# Patient Record
Sex: Female | Born: 1985 | Race: Black or African American | Hispanic: No | Marital: Single | Smoking: Never smoker
Health system: Southern US, Community
[De-identification: ages and names within clinical notes are randomized; demographics above are authoritative.]

## PROBLEM LIST (undated history)

## (undated) ENCOUNTER — Inpatient Hospital Stay (HOSPITAL_COMMUNITY): Payer: Self-pay

## (undated) DIAGNOSIS — L309 Dermatitis, unspecified: Secondary | ICD-10-CM

## (undated) HISTORY — DX: Dermatitis, unspecified: L30.9

## (undated) HISTORY — PX: NO PAST SURGERIES: SHX2092

---
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
## 2006-05-14 ENCOUNTER — Emergency Department (HOSPITAL_COMMUNITY): Admission: EM | Admit: 2006-05-14 | Discharge: 2006-05-14 | Payer: Self-pay | Admitting: Emergency Medicine

## 2008-09-11 ENCOUNTER — Emergency Department (HOSPITAL_COMMUNITY): Admission: EM | Admit: 2008-09-11 | Discharge: 2008-09-11 | Payer: Self-pay | Admitting: Emergency Medicine

## 2009-03-06 ENCOUNTER — Emergency Department (HOSPITAL_COMMUNITY): Admission: EM | Admit: 2009-03-06 | Discharge: 2009-03-06 | Payer: Self-pay | Admitting: Family Medicine

## 2010-03-29 LAB — GC/CHLAMYDIA PROBE AMP, GENITAL
Chlamydia, DNA Probe: NEGATIVE
GC Probe Amp, Genital: NEGATIVE

## 2010-03-29 LAB — POCT URINALYSIS DIP (DEVICE)
Bilirubin Urine: NEGATIVE
Glucose, UA: NEGATIVE mg/dL
Hgb urine dipstick: NEGATIVE
Ketones, ur: NEGATIVE mg/dL
Nitrite: NEGATIVE
Protein, ur: NEGATIVE mg/dL
Specific Gravity, Urine: 1.02 (ref 1.005–1.030)
Urobilinogen, UA: 1 mg/dL (ref 0.0–1.0)
pH: 7 (ref 5.0–8.0)

## 2010-03-29 LAB — WET PREP, GENITAL
Trich, Wet Prep: NONE SEEN
WBC, Wet Prep HPF POC: NONE SEEN
Yeast Wet Prep HPF POC: NONE SEEN

## 2010-03-29 LAB — POCT PREGNANCY, URINE: Preg Test, Ur: NEGATIVE

## 2010-04-10 LAB — POCT URINALYSIS DIP (DEVICE)
Bilirubin Urine: NEGATIVE
Glucose, UA: NEGATIVE mg/dL
Ketones, ur: NEGATIVE mg/dL
Nitrite: NEGATIVE
Protein, ur: 30 mg/dL — AB
Specific Gravity, Urine: >=1.03 (ref 1.005–1.030)
Urobilinogen, UA: 1 mg/dL (ref 0.0–1.0)
pH: 6 (ref 5.0–8.0)

## 2010-04-10 LAB — WET PREP, GENITAL: Trich, Wet Prep: NONE SEEN

## 2010-04-10 LAB — GC/CHLAMYDIA PROBE AMP, GENITAL
Chlamydia, DNA Probe: NEGATIVE
GC Probe Amp, Genital: NEGATIVE

## 2010-12-16 ENCOUNTER — Emergency Department (HOSPITAL_COMMUNITY)
Admission: EM | Admit: 2010-12-16 | Discharge: 2010-12-16 | Disposition: A | Discharge status: Home / Self Care | Payer: Self-pay | Attending: Emergency Medicine | Admitting: Emergency Medicine

## 2010-12-16 ENCOUNTER — Encounter: Payer: Self-pay | Admitting: *Deleted

## 2010-12-16 DIAGNOSIS — R059 Cough, unspecified: Secondary | ICD-10-CM | POA: Insufficient documentation

## 2010-12-16 DIAGNOSIS — R509 Fever, unspecified: Secondary | ICD-10-CM | POA: Insufficient documentation

## 2010-12-16 DIAGNOSIS — R079 Chest pain, unspecified: Secondary | ICD-10-CM | POA: Insufficient documentation

## 2010-12-16 DIAGNOSIS — R51 Headache: Secondary | ICD-10-CM | POA: Insufficient documentation

## 2010-12-16 DIAGNOSIS — R05 Cough: Secondary | ICD-10-CM | POA: Insufficient documentation

## 2010-12-16 DIAGNOSIS — J45909 Unspecified asthma, uncomplicated: Secondary | ICD-10-CM | POA: Insufficient documentation

## 2010-12-16 MED ORDER — KETOROLAC TROMETHAMINE 60 MG/2ML IM SOLN
60.0000 mg | Freq: Once | INTRAMUSCULAR | Status: AC
  Administered 2010-12-16: 60 mg via INTRAMUSCULAR
  Filled 2010-12-16: qty 2

## 2010-12-16 NOTE — ED Provider Notes (Signed)
History     CSN: [id number] Arrival date & time: 12/16/2010  3:59 PM   First MD Initiated Contact with Patient 12/16/10 1803      Chief Complaint  Patient presents with  . Headache  . Cough  . Fever    (Consider location/radiation/quality/duration/timing/severity/associated sxs/prior treatment) HPI This previously well 25 year old female now presents with 3 days of headache, cough, chest pain associated with coughing. She notes her symptoms began gradually and since onset has been worsening. She notes minimal improvement with Advil. No clear provoking factors although the pain seems to be worsening with each prolonged coughing episode. No vomiting no diarrhea no fever no confusion.  Past Medical History  Diagnosis Date  . Asthma     History reviewed. No pertinent past surgical history.  History reviewed. No pertinent family history.  History  Substance Use Topics  . Smoking status: Never Smoker   . Smokeless tobacco: Not on file  . Alcohol Use: No    OB History    Grav Para Term Preterm Abortions TAB SAB Ect Mult Living                  Review of Systems  Constitutional:       HPI  HENT:       HPI otherwise negative  Eyes: Negative.   Respiratory:       HPI, otherwise negative  Cardiovascular:       HPI, otherwise nmegative  Gastrointestinal: Negative for vomiting.  Genitourinary:       HPI, otherwise negative  Musculoskeletal:       HPI, otherwise negative  Skin: Negative.   Neurological: Negative for syncope.    Allergies  Review of patient's allergies indicates no known allergies.  Home Medications   Current Outpatient Rx  Name Route Sig Dispense Refill  . IBUPROFEN 200 MG PO TABS Oral Take 400 mg by mouth daily as needed. For pain.       BP 124/78  Pulse 109  Resp 16  Physical Exam  Nursing note and vitals reviewed. Constitutional: She is oriented to person, place, and time. She appears well-developed and well-nourished.  HENT:  Head:  Normocephalic and atraumatic.  Mouth/Throat: Oropharynx is clear and moist. No oropharyngeal exudate.  Eyes: EOM are normal.  Cardiovascular: Normal rate and regular rhythm.   Pulmonary/Chest: Effort normal and breath sounds normal.  Abdominal: She exhibits no distension.  Musculoskeletal: She exhibits no edema and no tenderness.  Neurological: She is alert and oriented to person, place, and time.  Skin: Skin is warm and dry.    ED Course  Procedures (including critical care time)  Labs Reviewed - No data to display No results found.   No diagnosis found.    MDM  This previously well 25 year old female now presents with 3 days of flulike symptoms. On exam the patient is in no distress though she is uncomfortable appearing patient vital signs are notable for mild tachycardia. Given the absence of distress, patient's description of the flulike illness she is appropriate for discharge with continued outpatient evaluation. She was provided home care instructions, and while in ED received Toradol. She will follow up with her primary care physician        Gerhard Munch, MD 12/16/10 1901

## 2010-12-16 NOTE — ED Notes (Signed)
Pt reports 3 day hx of flu like symptoms. Reports headache, body aches, fever, and productive cough.

## 2011-07-15 ENCOUNTER — Encounter (HOSPITAL_COMMUNITY): Payer: Self-pay | Admitting: *Deleted

## 2011-07-15 ENCOUNTER — Inpatient Hospital Stay (HOSPITAL_COMMUNITY)
Admission: AD | Admit: 2011-07-15 | Discharge: 2011-07-15 | Disposition: A | Discharge status: Home / Self Care | Payer: Self-pay | Source: Ambulatory Visit | Attending: Obstetrics & Gynecology | Admitting: Obstetrics & Gynecology

## 2011-07-15 DIAGNOSIS — A499 Bacterial infection, unspecified: Secondary | ICD-10-CM | POA: Insufficient documentation

## 2011-07-15 DIAGNOSIS — Z3202 Encounter for pregnancy test, result negative: Secondary | ICD-10-CM | POA: Insufficient documentation

## 2011-07-15 DIAGNOSIS — B9689 Other specified bacterial agents as the cause of diseases classified elsewhere: Secondary | ICD-10-CM

## 2011-07-15 DIAGNOSIS — N912 Amenorrhea, unspecified: Secondary | ICD-10-CM | POA: Insufficient documentation

## 2011-07-15 DIAGNOSIS — N760 Acute vaginitis: Secondary | ICD-10-CM

## 2011-07-15 LAB — URINALYSIS, ROUTINE W REFLEX MICROSCOPIC
Bilirubin Urine: NEGATIVE
Ketones, ur: NEGATIVE mg/dL
Nitrite: NEGATIVE
Urobilinogen, UA: 0.2 mg/dL (ref 0.0–1.0)
pH: 6 (ref 5.0–8.0)

## 2011-07-15 LAB — WET PREP, GENITAL: WBC, Wet Prep HPF POC: NONE SEEN

## 2011-07-15 MED ORDER — METRONIDAZOLE 500 MG PO TABS: 2000.0000 mg | ORAL_TABLET | Freq: Three times a day (TID) | ORAL | Status: AC

## 2011-07-15 NOTE — MAU Note (Signed)
Patient states she is one week late for her period. Has a little vaginal discharge for 5-6 days, slight odor. No pain or bleeding.

## 2011-07-15 NOTE — MAU Provider Note (Signed)
  History  CSN: [id number]  Arrival date and time: 07/15/11 1045   First Provider Initiated Contact with Patient 07/15/11 1306      Chief Complaint  Patient presents with  . Possible Pregnancy  . Vaginal Discharge   HPI Pt presents alone today. She is concerned about being pregnant since missing her period July 4, and was last sexually active 3 weeks ago when the condom broke. She takes no birth control currently. Previously on Depo Provera until a year ago, discontinued due to weight gain. No abdominal pain or discomfort. Denied nausea or vomiting.   Vaginal discharge started over 1 week ago. Pt reports it is malodorous, denies itching or burning in/around vagina. Previous history of BV, most recent March 2013. Reports current symptoms are similar.  OB History    Grav Para Term Preterm Abortions TAB SAB Ect Mult Living   0               Past Medical History  Diagnosis Date  . Asthma     Past Surgical History  Procedure Date  . No past surgeries     Family History  Problem Relation Age of Onset  . Other Neg Hx     History  Substance Use Topics  . Smoking status: Never Smoker   . Smokeless tobacco: Not on file  . Alcohol Use: No    Allergies: No Known Allergies  Prescriptions prior to admission  Medication Sig Dispense Refill  . ALBUTEROL IN Inhale 2 puffs into the lungs daily as needed. For shortness of breath.        Review of Systems  All other systems reviewed and are negative.   Physical Exam   Blood pressure 118/67, pulse 78, temperature 98.8 F (37.1 C), temperature source Oral, resp. rate 16, height 5\' 5"  (1.651 m), weight 104.418 kg (230 lb 3.2 oz), last menstrual period 06/08/2011, SpO2 100.00%.  Physical Exam  Constitutional: Vital signs are normal. She appears well-developed and well-nourished. She is cooperative.  HENT:  Head: Normocephalic and atraumatic.  Cardiovascular: Normal rate and regular rhythm.   GI: Soft. Normal appearance and  bowel sounds are normal. There is no tenderness.  Genitourinary: Vagina normal and uterus normal. Pelvic exam was performed with patient supine. Cervix exhibits discharge (copious, white, malodorous). Cervix exhibits no motion tenderness and no friability. Right adnexum displays no mass, no tenderness and no fullness. Left adnexum displays no mass, no tenderness and no fullness.    MAU Course  Procedures  MDM 1. Urine pregnancy test ordered: negative for pregnancy 2. Pelvic exam completed, cultures taken:  Negative GC and chlamydia  Positive for bacterial vaginosis  Assessment and Plan  1. Negative pregnancy test  - Counseled pt on birth control options, condom and spermicidal usage to prevent pregnancy and STIs  2. Bacterial Vaginosis - Flagyl 500 mg BID ordered. - Pt counseled on the infection and medication  [place] 07/15/2011, 1:59 PM   Seen also by me. Agree with note.

## 2011-07-16 LAB — GC/CHLAMYDIA PROBE AMP, GENITAL: Chlamydia, DNA Probe: NEGATIVE

## 2012-01-02 ENCOUNTER — Encounter (HOSPITAL_COMMUNITY): Payer: Self-pay | Admitting: Emergency Medicine

## 2012-01-02 ENCOUNTER — Emergency Department (HOSPITAL_COMMUNITY)
Admission: EM | Admit: 2012-01-02 | Discharge: 2012-01-02 | Disposition: A | Discharge status: Home / Self Care | Payer: Self-pay | Attending: Emergency Medicine | Admitting: Emergency Medicine

## 2012-01-02 DIAGNOSIS — R51 Headache: Secondary | ICD-10-CM | POA: Insufficient documentation

## 2012-01-02 DIAGNOSIS — Z79899 Other long term (current) drug therapy: Secondary | ICD-10-CM | POA: Insufficient documentation

## 2012-01-02 DIAGNOSIS — B9789 Other viral agents as the cause of diseases classified elsewhere: Secondary | ICD-10-CM | POA: Insufficient documentation

## 2012-01-02 DIAGNOSIS — J45909 Unspecified asthma, uncomplicated: Secondary | ICD-10-CM | POA: Insufficient documentation

## 2012-01-02 DIAGNOSIS — J029 Acute pharyngitis, unspecified: Secondary | ICD-10-CM | POA: Insufficient documentation

## 2012-01-02 DIAGNOSIS — R509 Fever, unspecified: Secondary | ICD-10-CM | POA: Insufficient documentation

## 2012-01-02 DIAGNOSIS — B349 Viral infection, unspecified: Secondary | ICD-10-CM

## 2012-01-02 MED ORDER — OSELTAMIVIR PHOSPHATE 75 MG PO CAPS: 75.0000 mg | ORAL_CAPSULE | Freq: Two times a day (BID) | ORAL | Status: DC

## 2012-01-02 MED ORDER — KETOROLAC TROMETHAMINE 30 MG/ML IJ SOLN
30.0000 mg | Freq: Once | INTRAMUSCULAR | Status: AC
  Administered 2012-01-02: 30 mg via INTRAMUSCULAR
  Filled 2012-01-02: qty 1

## 2012-01-02 NOTE — ED Provider Notes (Signed)
History     CSN: [id number]  Arrival date & time 01/02/12  1918   First MD Initiated Contact with Patient 01/02/12 2029      Chief Complaint  Patient presents with  . Generalized Body Aches    (Consider location/radiation/quality/duration/timing/severity/associated sxs/prior treatment) HPI Comments: Patient reports, 2, days of generalized myalgias hot and cold spells, without nausea, vomiting, diarrhea, sore throat, cough.  She states today, she developed a headache.  She has not taken any over-the-counter medication for symptom relief.  She states she got a flu shot 3 or 4 months ago.  She does not work outside of the home.  She has no ill contacts, that she is aware of  The history is provided by the patient.    Past Medical History  Diagnosis Date  . Asthma     Past Surgical History  Procedure Date  . No past surgeries     Family History  Problem Relation Age of Onset  . Other Neg Hx     History  Substance Use Topics  . Smoking status: Never Smoker   . Smokeless tobacco: Not on file  . Alcohol Use: No    OB History    Grav Para Term Preterm Abortions TAB SAB Ect Mult Living   0               Review of Systems  Constitutional: Positive for fever. Negative for chills.  HENT: Positive for sore throat.   Respiratory: Negative for cough and shortness of breath.   Gastrointestinal: Negative for nausea, vomiting and abdominal pain.  Genitourinary: Negative for dysuria.  Neurological: Positive for headaches.  Hematological: Negative for adenopathy.    Allergies  Review of patient's allergies indicates no known allergies.  Home Medications   Current Outpatient Rx  Name  Route  Sig  Dispense  Refill  . ALBUTEROL SULFATE HFA 108 (90 BASE) MCG/ACT IN AERS   Inhalation   Inhale 2 puffs into the lungs every 6 (six) hours as needed. For cough or shortness of breath           BP 121/74  Pulse 100  Temp 99.9 F (37.7 C) (Oral)  Resp 16  SpO2 97%  LMP  01/02/2012  Physical Exam  Constitutional: She is oriented to person, place, and time. She appears well-developed and well-nourished.  HENT:  Head: Normocephalic.  Eyes: Pupils are equal, round, and reactive to light.  Neck: Normal range of motion.  Cardiovascular: Tachycardia present.   Pulmonary/Chest: Effort normal and breath sounds normal. She has no wheezes. She exhibits no tenderness.  Abdominal: Soft. She exhibits distension.  Musculoskeletal: Normal range of motion. She exhibits no edema.  Lymphadenopathy:    She has no cervical adenopathy.  Neurological: She is alert and oriented to person, place, and time.  Skin: Skin is warm. No rash noted.    ED Course  Procedures (including critical care time)   Labs Reviewed  INFLUENZA PANEL BY PCR   No results found.   No diagnosis found.    MDM   Influenza PCR is pending patient will be notified in the morning.  If it is, positive.  She's been given a prescription for Tamiflu, with instructions not to fill this until she history must with the results of her test        Arman Filter, NP 01/03/12 0154

## 2012-01-02 NOTE — ED Notes (Signed)
Pt to ED via [facility] with c/o body aches onset yesterday.  Also c/o fever and chills.  Pt denies any nausea, vomiting or diarrhea.

## 2012-01-02 NOTE — Discharge Instructions (Signed)
Your flu test is, pending.  You've been given a prescription for Tamiflu, please do not feel this until you hear if your test is positive for

## 2012-01-03 LAB — INFLUENZA PANEL BY PCR (TYPE A & B): H1N1 flu by pcr: NOT DETECTED

## 2012-01-03 NOTE — ED Provider Notes (Signed)
Medical screening examination/treatment/procedure(s) were performed by non-physician practitioner and as supervising physician I was immediately available for consultation/collaboration.  Jonatan Wilsey T Kaleya Douse, MD 01/03/12 1509 

## 2013-01-04 NOTE — L&D Delivery Note (Addendum)
Delivery Note At 4:46 PM a stillborn female sex was delivered via Vaginal, Spontaneous Delivery (Presentation: ; Occiput Anterior). Placenta status: Intact, with moderate sized abruption noted Spontaneous.  Cord: 3 vessels with the following complications: None.  Anesthesia: Epidural  Episiotomy: None Lacerations: None Suture Repair: n/a Est. Blood Loss (mL): 200  Mom to postpartum.  Baby to [place].  Sal Spratley ROCIO 08/31/2013, 5:09 PM

## 2013-04-17 ENCOUNTER — Encounter (HOSPITAL_COMMUNITY): Payer: Self-pay | Admitting: Family

## 2013-04-17 ENCOUNTER — Inpatient Hospital Stay (HOSPITAL_COMMUNITY)
Admission: AD | Admit: 2013-04-17 | Discharge: 2013-04-17 | Disposition: A | Discharge status: Home / Self Care | Payer: Medicaid Other | Source: Ambulatory Visit | Attending: Obstetrics & Gynecology | Admitting: Obstetrics & Gynecology

## 2013-04-17 DIAGNOSIS — J45909 Unspecified asthma, uncomplicated: Secondary | ICD-10-CM | POA: Insufficient documentation

## 2013-04-17 DIAGNOSIS — Z3201 Encounter for pregnancy test, result positive: Secondary | ICD-10-CM

## 2013-04-17 LAB — POCT PREGNANCY, URINE: PREG TEST UR: POSITIVE — AB

## 2013-04-17 NOTE — MAU Note (Signed)
Patient states she has had a positive home pregnancy test. Wants confirmation and to know how far she is. Patient denies pain, bleeding ,discharge, nausea, vomiting or diarrhea.

## 2013-04-17 NOTE — Discharge Instructions (Signed)
[facility]atal Care [facility]    [facility]  [phone]     Phone: [phone]          [facility]                      [facility]  @[place]     Phone: [phone]  Phone: [phone]         [facility]     Phone: [phone]  Phone: [phone]           [facility] [place]                hone: [phone]  Phone: [phone]         [facility] [address]      Phone: [phone]  Phone: [phone]         [facility]                Phone: [phone]  [facility]   [address][phone]    [facility]          Phone: [phone] [facility]   Phone: [phone]  Prenatal Care  WHAT IS PRENATAL CARE?  Prenatal care means health care during your pregnancy, before your baby is born. It is very important to take care of yourself and your baby during your pregnancy by:   Getting early prenatal care. If you know you are pregnant, or think you might be pregnant, call your health care provider as soon as possible. Schedule a visit for a prenatal exam.  Getting regular prenatal care. Follow your health care provider's schedule for blood and other necessary tests. Do not miss appointments.  Doing everything you can to keep yourself and your baby healthy during your pregnancy.  Getting complete care. Prenatal care should include evaluation of the medical, dietary, educational, psychological, and social needs of you and your significant other. The medical and genetic history of your family and the family of your baby's father should be discussed with your health care provider.  Discussing with your health care provider:  Prescription, over-the-counter, and herbal medicines that you take.  Any history of substance abuse, alcohol use, smoking, and  illegal drug use.  Any history of domestic abuse and violence.  Immunizations you have received.  Your nutrition and diet.  The amount of exercise you do.  Any environmental and occupational hazards to which you are exposed.  History of sexually transmitted infections for both you and your partner.  Previous pregnancies you have had. WHY IS PRENATAL CARE SO IMPORTANT?  By regularly seeing your health care provider, you help ensure that problems can be identified early so that they can be treated as soon as possible. Other problems might be prevented. Many studies have shown that early and regular prenatal care is important for the health of mothers and their babies.  HOW CAN I TAKE CARE OF MYSELF WHILE I AM PREGNANT?  Here are ways to take care of yourself and your baby:   Start or continue taking your multivitamin with 400 micrograms (mcg) of folic acid every day.  Get early and regular prenatal care. It is very important to see a health care provider during your pregnancy. Your health care provider will check at each visit to make sure that you and the  baby are healthy. If there are any problems, action can be taken right away to help you and the baby.  Eat a healthy diet that includes:  Fruits.  Vegetables.  Foods low in saturated fat.  Whole grains.  Calcium-rich foods, such as milk, yogurt, and hard cheeses.  Drink 6 to 8 glasses of liquids a day.  Unless your health care provider tells you not to, try to be physically active for 30 minutes, most days of the week. If you are pressed for time, you can get your activity in through 10-minute segments, three times a day.  Do not smoke, drink alcohol, or use drugs. These can cause long-term damage to your baby. Talk with your health care provider about steps to take to stop smoking. Talk with a member of your faith community, a counselor, a trusted friend, or your health care provider if you are concerned about your alcohol or  drug use.  Ask your health care provider before taking any medicine, even over-the-counter medicines. Some medicines are not safe to take during pregnancy.  Get plenty of rest and sleep.  Avoid hot tubs and saunas during pregnancy.  Do not have X-rays taken unless absolutely necessary and with the recommendation of your health care provider. A lead shield can be placed on your abdomen to protect the baby when X-rays are taken in other parts of the body.  Do not empty the cat litter when you are pregnant. It may contain a parasite that causes an infection called toxoplasmosis, which can cause birth defects. Also, use gloves when working in garden areas used by cats.  Do not eat uncooked or undercooked meats or fish.  Do not eat soft, mold-ripened cheeses (Brie, Camembert, and chevre) or soft, blue-veined cheese (Danish blue and Roquefort).  Stay away from toxic chemicals like:  Insecticides.  Solvents (some cleaners or paint thinners).  Lead.  Mercury.  Sexual intercourse may continue until the end of the pregnancy, unless you have a medical problem or there is a problem with the pregnancy and your health care provider tells you not to.  Do not wear high-heel shoes, especially during the second half of the pregnancy. You can lose your balance and fall.  Do not take long trips, unless absolutely necessary. Be sure to see your health care provider before going on the trip.  Do not sit in one position for more than 2 hours when on a trip.  Take a copy of your medical records when going on a trip. Know where a hospital is located in the city you are visiting, in case of an emergency.  Most dangerous household products will have pregnancy warnings on their labels. Ask your health care provider about products if you are unsure.  Limit or eliminate your caffeine intake from coffee, tea, sodas, medicines, and chocolate.  Many women continue working through pregnancy. Staying active  might help you stay healthier. If you have a question about the safety or the hours you work at your particular job, talk with your health care provider.  Get informed:  Read books.  Watch videos.  Go to childbirth classes for you and your significant other.  Talk with experienced moms.  Ask your health care provider about childbirth education classes for you and your partner. Classes can help you and your partner prepare for the birth of your baby.  Ask about a baby doctor (pediatrician) and methods and pain medicine for labor, delivery, and possible cesarean delivery. HOW OFTEN SHOULD  I SEE MY HEALTH CARE PROVIDER DURING PREGNANCY?  Your health care provider will give you a schedule for your prenatal visits. You will have visits more often as you get closer to the end of your pregnancy. An average pregnancy lasts about 40 weeks.  A typical schedule includes visiting your health care provider:   About once each month during your first 6 months of pregnancy.  Every 2 weeks during the next 2 months.  Weekly in the last month, until the delivery date. Your health care provider will probably want to see you more often if:  You are older than 35 years.  Your pregnancy is high risk because you have certain health problems or problems with the pregnancy, such as:  Diabetes.  High blood pressure.  The baby is not growing on schedule, according to the dates of the pregnancy. Your health care provider will do special tests to make sure you and the baby are not having any serious problems. WHAT HAPPENS DURING PRENATAL VISITS?   At your first prenatal visit, your health care provider will do a physical exam and talk to you about your health history and the health history of your partner and your family. Your health care provider will be able to tell you what date to expect your baby to be born on.  Your first physical exam will include checks of your blood pressure, measurements of your  height and weight, and an exam of your pelvic organs. Your health care provider will do a Pap test if you have not had one recently and will do cultures of your cervix to make sure there is no infection.  At each prenatal visit, there will be tests of your blood, urine, blood pressure, weight, and checking the progress of the baby.  At your later prenatal visits, your health care provider will check how you are doing and how the baby is developing. You may have a number of tests done as your pregnancy progresses.  Ultrasound exams are often used to check on the baby's growth and health.  You may have more urine and blood tests, as well as special tests, if needed. These may include amniocentesis to examine fluid in the pregnancy sac, stress tests to check how the baby responds to contractions, or a biophysical profile to measure fetus well-being. Your health care provider will explain the tests and why they are necessary.  You should discuss with your health care provider your plans to breastfeed or bottle-feed your baby.  Each visit is also a chance for you to learn about staying healthy during pregnancy and to ask questions. Document Released: 12/24/2002 Document Revised: 10/11/2012 Document Reviewed: 06/08/2012 [facility] Patient Information 2014 [place] [place].

## 2013-04-17 NOTE — MAU Provider Note (Signed)
Attestation of Attending Supervision of Advanced Practitioner (CNM/NP): Evaluation and management procedures were performed by the Advanced Practitioner under my supervision and collaboration. I have reviewed the Advanced Practitioner's note and chart, and I agree with the management and plan.  Lesly DukesKelly H Vermon Grays 10:57 PM

## 2013-04-17 NOTE — MAU Provider Note (Signed)
  History     CSN: [id number]  Arrival date and time: 04/17/13 1804   None     Chief Complaint  Patient presents with  . Possible Pregnancy   HPI  Pt is a 28 yo G1P0 at 6167w1d wks IUP by LMP.  Here for pregnancy confirmation.  Denies abdominal pain or vaginal bleeding.  Past Medical History  Diagnosis Date  . Asthma     Past Surgical History  Procedure Laterality Date  . No past surgeries      Family History  Problem Relation Age of Onset  . Other Neg Hx     History  Substance Use Topics  . Smoking status: Never Smoker   . Smokeless tobacco: Not on file  . Alcohol Use: No    Allergies: No Known Allergies  Prescriptions prior to admission  Medication Sig Dispense Refill  . albuterol (PROVENTIL HFA;VENTOLIN HFA) 108 (90 BASE) MCG/ACT inhaler Inhale 2 puffs into the lungs every 6 (six) hours as needed. For cough or shortness of breath        Review of Systems  Constitutional: Negative for fever.  Gastrointestinal: Negative for abdominal pain.   Pertinent information in HPI Physical Exam   Blood pressure 119/80, pulse 99, temperature 99.6 F (37.6 C), temperature source Oral, resp. rate 18, height 5' 3.5" (1.613 m), weight 222 lb 12.8 oz (101.061 kg), last menstrual period 01/22/2013, SpO2 100.00%.  Physical Exam  Constitutional: She is oriented to person, place, and time. She appears well-developed and well-nourished. No distress.  HENT:  Head: Normocephalic.  Neck: Normal range of motion. Neck supple.  GI: Soft. There is no tenderness.  FHR tones - 152  Neurological: She is alert and oriented to person, place, and time. She has normal reflexes.  Skin: Skin is warm and dry.    MAU Course  Procedures  Results for orders placed during the hospital encounter of 04/17/13 (from the past 24 hour(s))  POCT PREGNANCY, URINE     Status: Abnormal   Collection Time    04/17/13  6:28 PM      Result Value Ref Range   Preg Test, Ur POSITIVE (*) NEGATIVE     Assessment and Plan  Positive Pregnancy Test  Plan: Discharge to home Pregnancy verification letter given Given list to prenatal care provider Take OTC prenatal vitamins  Melissa NoonWalidah N Muhammad 04/17/2013, 7:19 PM

## 2013-05-15 ENCOUNTER — Encounter: Payer: Self-pay | Admitting: Obstetrics and Gynecology

## 2013-05-15 ENCOUNTER — Ambulatory Visit (INDEPENDENT_AMBULATORY_CARE_PROVIDER_SITE_OTHER): Payer: Self-pay | Admitting: Obstetrics and Gynecology

## 2013-05-15 VITALS — BP 117/84 | HR 91 | Temp 98.1°F | Wt 223.3 lb

## 2013-05-15 DIAGNOSIS — Z3480 Encounter for supervision of other normal pregnancy, unspecified trimester: Secondary | ICD-10-CM

## 2013-05-15 DIAGNOSIS — Z3402 Encounter for supervision of normal first pregnancy, second trimester: Secondary | ICD-10-CM | POA: Insufficient documentation

## 2013-05-15 DIAGNOSIS — Z3490 Encounter for supervision of normal pregnancy, unspecified, unspecified trimester: Secondary | ICD-10-CM

## 2013-05-15 DIAGNOSIS — E669 Obesity, unspecified: Secondary | ICD-10-CM

## 2013-05-15 DIAGNOSIS — Z3400 Encounter for supervision of normal first pregnancy, unspecified trimester: Secondary | ICD-10-CM

## 2013-05-15 LAB — POCT URINALYSIS DIP (DEVICE)
Bilirubin Urine: NEGATIVE
GLUCOSE, UA: NEGATIVE mg/dL
HGB URINE DIPSTICK: NEGATIVE
Leukocytes, UA: NEGATIVE
NITRITE: NEGATIVE
PH: 8.5 — AB (ref 5.0–8.0)
Protein, ur: 30 mg/dL — AB
Specific Gravity, Urine: 1.02 (ref 1.005–1.030)
UROBILINOGEN UA: 1 mg/dL (ref 0.0–1.0)

## 2013-05-15 LAB — OB RESULTS CONSOLE GC/CHLAMYDIA
Chlamydia: NEGATIVE
Gonorrhea: NEGATIVE

## 2013-05-15 LAB — OB RESULTS CONSOLE GBS: GBS: NEGATIVE

## 2013-05-15 NOTE — Progress Notes (Signed)
Patient reports discharge with slight odor

## 2013-05-15 NOTE — Progress Notes (Signed)
   Subjective:    Monique Cardenas is a G1P0000 706w1d being seen today for her first obstetrical visit.  Her obstetrical history is significant for obesity. Patient undecided intend to breast feed. Pregnancy history fully reviewed.  Patient reports no complaints and does have respiratory allergies and got no relief from Zyrtec. Vaginal discharge has malodor at times, no itch. Unplanned but desired. Has support. Looking for job.   Filed Vitals:   05/15/13 1509  BP: 117/84  Pulse: 91  Temp: 98.1 F (36.7 C)  Weight: 223 lb 4.8 oz (101.288 kg)    HISTORY: OB History  Gravida Para Term Preterm AB SAB TAB Ectopic Multiple Living  1 0 0 0 0 0 0 0 0 0     # Outcome Date GA Lbr Len/2nd Weight Sex Delivery Anes PTL Lv  1 CUR              Past Medical History  Diagnosis Date  . Asthma    Past Surgical History  Procedure Laterality Date  . No past surgeries     Family History  Problem Relation Age of Onset  . Other Neg Hx      Exam    Uterus:   S>D, fundus at U-1  Pelvic Exam:    Perineum: No Hemorrhoids   Vulva: normal   Vagina:  normal mucosa, normal discharge       Cervix: no cervical motion tenderness and L/C   Adnexa: not evaluated   Bony Pelvis: average  System: Breast:  normal appearance, no masses or tenderness   Skin: normal coloration and turgor, no rashes    Neurologic: oriented, normal, grossly non-focal   Extremities: normal strength, tone, and muscle mass   HEENT PERRLA and thyroid without masses   Mouth/Teeth mucous membranes moist, pharynx normal without lesions and dental hygiene good   Neck supple and no masses   Cardiovascular: regular rate and rhythm, no murmurs or gallops   Respiratory:  appears well, vitals normal, no respiratory distress, acyanotic, normal RR, ear and throat exam is normal, neck free of mass or lymphadenopathy, chest clear, no wheezing, crepitations, rhonchi, normal symmetric air entry   Abdomen: soft, non-tender; bowel sounds  normal; no masses,  no organomegaly   Urinary: urethral meatus normal      Assessment:    Pregnancy: G1P0000 Patient Active Problem List   Diagnosis Date Noted  . Obese 05/15/2013  . Supervision of normal first pregnancy in second trimester 05/15/2013        Plan:     Initial labs drawn. Early glucola today.  Prenatal vitamins. Problem list reviewed and updated. Genetic Screening discussed Quad Screen: ordered.  Ultrasound discussed; fetal survey: ordered.  Follow up in 4 weeks. 50% of 30 min visit spent on counseling and coordination of care.  Reviewed OTC Benadryl or Claritin use for allergies F/U schedule and labs reviewed   Deirdre C Poe 05/15/2013

## 2013-05-15 NOTE — Patient Instructions (Signed)
Second Trimester of Pregnancy The second trimester is from week 13 through week 28, months 4 through 6. The second trimester is often a time when you feel your best. Your body has also adjusted to being pregnant, and you begin to feel better physically. Usually, morning sickness has lessened or quit completely, you may have more energy, and you may have an increase in appetite. The second trimester is also a time when the fetus is growing rapidly. At the end of the sixth month, the fetus is about 9 inches long and weighs about 1 pounds. You will likely begin to feel the baby move (quickening) between 18 and 20 weeks of the pregnancy. BODY CHANGES Your body goes through many changes during pregnancy. The changes vary from woman to woman.   Your weight will continue to increase. You will notice your lower abdomen bulging out.  You may begin to get stretch marks on your hips, abdomen, and breasts.  You may develop headaches that can be relieved by medicines approved by your caregiver.  You may urinate more often because the fetus is pressing on your bladder.  You may develop or continue to have heartburn as a result of your pregnancy.  You may develop constipation because certain hormones are causing the muscles that push waste through your intestines to slow down.  You may develop hemorrhoids or swollen, bulging veins (varicose veins).  You may have back pain because of the weight gain and pregnancy hormones relaxing your joints between the bones in your pelvis and as a result of a shift in weight and the muscles that support your balance.  Your breasts will continue to grow and be tender.  Your gums may bleed and may be sensitive to brushing and flossing.  Dark spots or blotches (chloasma, mask of pregnancy) may develop on your face. This will likely fade after the baby is born.  A dark line from your belly button to the pubic area (linea nigra) may appear. This will likely fade after the  baby is born. WHAT TO EXPECT AT YOUR PRENATAL VISITS During a routine prenatal visit:  You will be weighed to make sure you and the fetus are growing normally.  Your blood pressure will be taken.  Your abdomen will be measured to track your baby's growth.  The fetal heartbeat will be listened to.  Any test results from the previous visit will be discussed. Your caregiver may ask you:  How you are feeling.  If you are feeling the baby move.  If you have had any abnormal symptoms, such as leaking fluid, bleeding, severe headaches, or abdominal cramping.  If you have any questions. Other tests that may be performed during your second trimester include:  Blood tests that check for:  Low iron levels (anemia).  Gestational diabetes (between 24 and 28 weeks).  Rh antibodies.  Urine tests to check for infections, diabetes, or protein in the urine.  An ultrasound to confirm the proper growth and development of the baby.  An amniocentesis to check for possible genetic problems.  Fetal screens for spina bifida and Down syndrome. HOME CARE INSTRUCTIONS   Avoid all smoking, herbs, alcohol, and unprescribed drugs. These chemicals affect the formation and growth of the baby.  Follow your caregiver's instructions regarding medicine use. There are medicines that are either safe or unsafe to take during pregnancy.  Exercise only as directed by your caregiver. Experiencing uterine cramps is a good sign to stop exercising.  Continue to eat regular,   healthy meals.  Wear a good support bra for breast tenderness.  Do not use hot tubs, steam rooms, or saunas.  Wear your seat belt at all times when driving.  Avoid raw meat, uncooked cheese, cat litter boxes, and soil used by cats. These carry germs that can cause birth defects in the baby.  Take your prenatal vitamins.  Try taking a stool softener (if your caregiver approves) if you develop constipation. Eat more high-fiber foods,  such as fresh vegetables or fruit and whole grains. Drink plenty of fluids to keep your urine clear or pale yellow.  Take warm sitz baths to soothe any pain or discomfort caused by hemorrhoids. Use hemorrhoid cream if your caregiver approves.  If you develop varicose veins, wear support hose. Elevate your feet for 15 minutes, 3 4 times a day. Limit salt in your diet.  Avoid heavy lifting, wear low heel shoes, and practice good posture.  Rest with your legs elevated if you have leg cramps or low back pain.  Visit your dentist if you have not gone yet during your pregnancy. Use a soft toothbrush to brush your teeth and be gentle when you floss.  A sexual relationship may be continued unless your caregiver directs you otherwise.  Continue to go to all your prenatal visits as directed by your caregiver. SEEK MEDICAL CARE IF:   You have dizziness.  You have mild pelvic cramps, pelvic pressure, or nagging pain in the abdominal area.  You have persistent nausea, vomiting, or diarrhea.  You have a bad smelling vaginal discharge.  You have pain with urination. SEEK IMMEDIATE MEDICAL CARE IF:   You have a fever.  You are leaking fluid from your vagina.  You have spotting or bleeding from your vagina.  You have severe abdominal cramping or pain.  You have rapid weight gain or loss.  You have shortness of breath with chest pain.  You notice sudden or extreme swelling of your face, hands, ankles, feet, or legs.  You have not felt your baby move in over an hour.  You have severe headaches that do not go away with medicine.  You have vision changes. Document Released: 12/15/2000 Document Revised: 08/23/2012 Document Reviewed: 02/22/2012 ExitCare Patient Information 2014 [employer].  

## 2013-05-16 LAB — OBSTETRIC PANEL
Antibody Screen: NEGATIVE
BASOS ABS: 0 10*3/uL (ref 0.0–0.1)
Basophils Relative: 0 % (ref 0–1)
Eosinophils Absolute: 0.4 10*3/uL (ref 0.0–0.7)
Eosinophils Relative: 3 % (ref 0–5)
HEMATOCRIT: 34.8 % — AB (ref 36.0–46.0)
HEP B S AG: NEGATIVE
Hemoglobin: 12.1 g/dL (ref 12.0–15.0)
LYMPHS ABS: 2 10*3/uL (ref 0.7–4.0)
LYMPHS PCT: 14 % (ref 12–46)
MCH: 31.1 pg (ref 26.0–34.0)
MCHC: 34.8 g/dL (ref 30.0–36.0)
MCV: 89.5 fL (ref 78.0–100.0)
Monocytes Absolute: 0.6 10*3/uL (ref 0.1–1.0)
Monocytes Relative: 4 % (ref 3–12)
NEUTROS ABS: 11.5 10*3/uL — AB (ref 1.7–7.7)
Neutrophils Relative %: 79 % — ABNORMAL HIGH (ref 43–77)
PLATELETS: 313 10*3/uL (ref 150–400)
RBC: 3.89 MIL/uL (ref 3.87–5.11)
RDW: 14.2 % (ref 11.5–15.5)
RUBELLA: 3.68 {index} — AB (ref <0.90)
Rh Type: POSITIVE
WBC: 14.5 10*3/uL — AB (ref 4.0–10.5)

## 2013-05-16 LAB — AFP, QUAD SCREEN
AFP: 24.5 [IU]/mL
Curr Gest Age: 16.1 wks.days
Down Syndrome Scr Risk Est: 1:18600 {titer}
HCG, Total: 25794 m[IU]/mL
INH: 155.6 pg/mL
Interpretation-AFP: NEGATIVE
MOM FOR AFP: 1.1
MoM for INH: 1.04
MoM for hCG: 1.46
OPEN SPINA BIFIDA: NEGATIVE
Osb Risk: 1:9490 {titer}
Tri 18 Scr Risk Est: NEGATIVE
uE3 Mom: 1.48
uE3 Value: 0.7 ng/mL

## 2013-05-16 LAB — HIV ANTIBODY (ROUTINE TESTING W REFLEX): HIV 1&2 Ab, 4th Generation: NONREACTIVE

## 2013-05-16 LAB — WET PREP, GENITAL
Clue Cells Wet Prep HPF POC: NONE SEEN
TRICH WET PREP: NONE SEEN
YEAST WET PREP: NONE SEEN

## 2013-05-16 LAB — GC/CHLAMYDIA PROBE AMP
CT Probe RNA: NEGATIVE
GC Probe RNA: NEGATIVE

## 2013-05-16 LAB — GLUCOSE TOLERANCE, 1 HOUR (50G) W/O FASTING: GLUCOSE 1 HOUR GTT: 96 mg/dL (ref 70–140)

## 2013-05-17 LAB — CULTURE, OB URINE

## 2013-05-17 LAB — HEMOGLOBINOPATHY EVALUATION
HEMOGLOBIN OTHER: 0 %
HGB F QUANT: 0.3 % (ref 0.0–2.0)
HGB S QUANTITAION: 0 %
Hgb A2 Quant: 2.5 % (ref 2.2–3.2)
Hgb A: 97.2 % (ref 96.8–97.8)

## 2013-05-18 LAB — CANNABANOIDS (GC/LC/MS), URINE: THC-COOH UR CONFIRM: 34 ng/mL — AB (ref <5)

## 2013-05-19 LAB — PRESCRIPTION MONITORING PROFILE (19 PANEL)
Amphetamine/Meth: NEGATIVE ng/mL
BARBITURATE SCREEN, URINE: NEGATIVE ng/mL
BENZODIAZEPINE SCREEN, URINE: NEGATIVE ng/mL
BUPRENORPHINE, URINE: NEGATIVE ng/mL
COCAINE METABOLITES: NEGATIVE ng/mL
Carisoprodol, Urine: NEGATIVE ng/mL
Creatinine, Urine: 268.03 mg/dL (ref >20.0)
FENTANYL URINE: NEGATIVE ng/mL
MDMA URINE: NEGATIVE ng/mL
METHAQUALONE SCREEN (URINE): NEGATIVE ng/mL
Meperidine, Ur: NEGATIVE ng/mL
Methadone Screen, Urine: NEGATIVE ng/mL
Nitrites, Initial: NEGATIVE ug/mL
OPIATE SCREEN, URINE: NEGATIVE ng/mL
Oxycodone Screen, Ur: NEGATIVE ng/mL
PH URINE, INITIAL: 7.9 pH (ref 4.5–8.9)
Phencyclidine, Ur: NEGATIVE ng/mL
Propoxyphene: NEGATIVE ng/mL
TRAMADOL UR: NEGATIVE ng/mL
Tapentadol, urine: NEGATIVE ng/mL
ZOLPIDEM, URINE: NEGATIVE ng/mL

## 2013-06-05 ENCOUNTER — Ambulatory Visit (HOSPITAL_COMMUNITY)
Admission: RE | Admit: 2013-06-05 | Discharge: 2013-06-05 | Disposition: A | Discharge status: Home / Self Care | Payer: Medicaid Other | Source: Ambulatory Visit | Attending: Obstetrics and Gynecology | Admitting: Obstetrics and Gynecology

## 2013-06-05 DIAGNOSIS — O99210 Obesity complicating pregnancy, unspecified trimester: Principal | ICD-10-CM

## 2013-06-05 DIAGNOSIS — Z3402 Encounter for supervision of normal first pregnancy, second trimester: Secondary | ICD-10-CM

## 2013-06-05 DIAGNOSIS — Z3490 Encounter for supervision of normal pregnancy, unspecified, unspecified trimester: Secondary | ICD-10-CM

## 2013-06-05 DIAGNOSIS — E669 Obesity, unspecified: Secondary | ICD-10-CM | POA: Insufficient documentation

## 2013-06-05 DIAGNOSIS — Z3689 Encounter for other specified antenatal screening: Secondary | ICD-10-CM | POA: Insufficient documentation

## 2013-06-12 ENCOUNTER — Encounter: Payer: Self-pay | Admitting: Obstetrics and Gynecology

## 2013-06-12 ENCOUNTER — Ambulatory Visit (INDEPENDENT_AMBULATORY_CARE_PROVIDER_SITE_OTHER): Payer: Medicaid Other | Admitting: Obstetrics and Gynecology

## 2013-06-12 ENCOUNTER — Ambulatory Visit (HOSPITAL_COMMUNITY): Payer: Self-pay

## 2013-06-12 VITALS — BP 119/78 | HR 98 | Temp 98.8°F | Wt 226.0 lb

## 2013-06-12 DIAGNOSIS — E669 Obesity, unspecified: Secondary | ICD-10-CM

## 2013-06-12 DIAGNOSIS — Z3400 Encounter for supervision of normal first pregnancy, unspecified trimester: Secondary | ICD-10-CM

## 2013-06-12 LAB — POCT URINALYSIS DIP (DEVICE)
BILIRUBIN URINE: NEGATIVE
Glucose, UA: NEGATIVE mg/dL
HGB URINE DIPSTICK: NEGATIVE
KETONES UR: NEGATIVE mg/dL
Nitrite: NEGATIVE
PH: 7 (ref 5.0–8.0)
Protein, ur: NEGATIVE mg/dL
SPECIFIC GRAVITY, URINE: 1.02 (ref 1.005–1.030)
Urobilinogen, UA: 0.2 mg/dL (ref 0.0–1.0)

## 2013-06-12 NOTE — Patient Instructions (Signed)
Second Trimester of Pregnancy The second trimester is from week 13 through week 28, months 4 through 6. The second trimester is often a time when you feel your best. Your body has also adjusted to being pregnant, and you begin to feel better physically. Usually, morning sickness has lessened or quit completely, you may have more energy, and you may have an increase in appetite. The second trimester is also a time when the fetus is growing rapidly. At the end of the sixth month, the fetus is about 9 inches long and weighs about 1 pounds. You will likely begin to feel the baby move (quickening) between 18 and 20 weeks of the pregnancy. BODY CHANGES Your body goes through many changes during pregnancy. The changes vary from woman to woman.   Your weight will continue to increase. You will notice your lower abdomen bulging out.  You may begin to get stretch marks on your hips, abdomen, and breasts.  You may develop headaches that can be relieved by medicines approved by your caregiver.  You may urinate more often because the fetus is pressing on your bladder.  You may develop or continue to have heartburn as a result of your pregnancy.  You may develop constipation because certain hormones are causing the muscles that push waste through your intestines to slow down.  You may develop hemorrhoids or swollen, bulging veins (varicose veins).  You may have back pain because of the weight gain and pregnancy hormones relaxing your joints between the bones in your pelvis and as a result of a shift in weight and the muscles that support your balance.  Your breasts will continue to grow and be tender.  Your gums may bleed and may be sensitive to brushing and flossing.  Dark spots or blotches (chloasma, mask of pregnancy) may develop on your face. This will likely fade after the baby is born.  A dark line from your belly button to the pubic area (linea nigra) may appear. This will likely fade after the  baby is born. WHAT TO EXPECT AT YOUR PRENATAL VISITS During a routine prenatal visit:  You will be weighed to make sure you and the fetus are growing normally.  Your blood pressure will be taken.  Your abdomen will be measured to track your baby's growth.  The fetal heartbeat will be listened to.  Any test results from the previous visit will be discussed. Your caregiver may ask you:  How you are feeling.  If you are feeling the baby move.  If you have had any abnormal symptoms, such as leaking fluid, bleeding, severe headaches, or abdominal cramping.  If you have any questions. Other tests that may be performed during your second trimester include:  Blood tests that check for:  Low iron levels (anemia).  Gestational diabetes (between 24 and 28 weeks).  Rh antibodies.  Urine tests to check for infections, diabetes, or protein in the urine.  An ultrasound to confirm the proper growth and development of the baby.  An amniocentesis to check for possible genetic problems.  Fetal screens for spina bifida and Down syndrome. HOME CARE INSTRUCTIONS   Avoid all smoking, herbs, alcohol, and unprescribed drugs. These chemicals affect the formation and growth of the baby.  Follow your caregiver's instructions regarding medicine use. There are medicines that are either safe or unsafe to take during pregnancy.  Exercise only as directed by your caregiver. Experiencing uterine cramps is a good sign to stop exercising.  Continue to eat regular,   healthy meals.  Wear a good support bra for breast tenderness.  Do not use hot tubs, steam rooms, or saunas.  Wear your seat belt at all times when driving.  Avoid raw meat, uncooked cheese, cat litter boxes, and soil used by cats. These carry germs that can cause birth defects in the baby.  Take your prenatal vitamins.  Try taking a stool softener (if your caregiver approves) if you develop constipation. Eat more high-fiber foods,  such as fresh vegetables or fruit and whole grains. Drink plenty of fluids to keep your urine clear or pale yellow.  Take warm sitz baths to soothe any pain or discomfort caused by hemorrhoids. Use hemorrhoid cream if your caregiver approves.  If you develop varicose veins, wear support hose. Elevate your feet for 15 minutes, 3 4 times a day. Limit salt in your diet.  Avoid heavy lifting, wear low heel shoes, and practice good posture.  Rest with your legs elevated if you have leg cramps or low back pain.  Visit your dentist if you have not gone yet during your pregnancy. Use a soft toothbrush to brush your teeth and be gentle when you floss.  A sexual relationship may be continued unless your caregiver directs you otherwise.  Continue to go to all your prenatal visits as directed by your caregiver. SEEK MEDICAL CARE IF:   You have dizziness.  You have mild pelvic cramps, pelvic pressure, or nagging pain in the abdominal area.  You have persistent nausea, vomiting, or diarrhea.  You have a bad smelling vaginal discharge.  You have pain with urination. SEEK IMMEDIATE MEDICAL CARE IF:   You have a fever.  You are leaking fluid from your vagina.  You have spotting or bleeding from your vagina.  You have severe abdominal cramping or pain.  You have rapid weight gain or loss.  You have shortness of breath with chest pain.  You notice sudden or extreme swelling of your face, hands, ankles, feet, or legs.  You have not felt your baby move in over an hour.  You have severe headaches that do not go away with medicine.  You have vision changes. Document Released: 12/15/2000 Document Revised: 08/23/2012 Document Reviewed: 02/22/2012 ExitCare Patient Information 2014 [employer].  

## 2013-06-12 NOTE — Progress Notes (Signed)
Reviewed PN labs all essentially WNL, US> F/U heart views in 6 wks. Doing well. No concerns.

## 2013-07-10 ENCOUNTER — Ambulatory Visit (INDEPENDENT_AMBULATORY_CARE_PROVIDER_SITE_OTHER): Payer: Medicaid Other | Admitting: Obstetrics and Gynecology

## 2013-07-10 ENCOUNTER — Encounter: Payer: Self-pay | Admitting: Obstetrics and Gynecology

## 2013-07-10 VITALS — BP 131/83 | HR 97 | Temp 98.4°F | Wt 227.8 lb

## 2013-07-10 DIAGNOSIS — Z3492 Encounter for supervision of normal pregnancy, unspecified, second trimester: Secondary | ICD-10-CM

## 2013-07-10 DIAGNOSIS — Z3480 Encounter for supervision of other normal pregnancy, unspecified trimester: Secondary | ICD-10-CM

## 2013-07-10 DIAGNOSIS — E669 Obesity, unspecified: Secondary | ICD-10-CM

## 2013-07-10 LAB — POCT URINALYSIS DIP (DEVICE)
Bilirubin Urine: NEGATIVE
Glucose, UA: NEGATIVE mg/dL
Nitrite: NEGATIVE
Protein, ur: 30 mg/dL — AB
Urobilinogen, UA: 1 mg/dL (ref 0.0–1.0)
pH: 6.5 (ref 5.0–8.0)

## 2013-07-10 NOTE — Patient Instructions (Signed)
Contraception Choices Contraception (birth control) is the use of any methods or devices to prevent pregnancy. Below are some methods to help avoid pregnancy. HORMONAL METHODS   Contraceptive implant. This is a thin, plastic tube containing progesterone hormone. It does not contain estrogen hormone. Your health care provider inserts the tube in the inner part of the upper arm. The tube can remain in place for up to 3 years. After 3 years, the implant must be removed. The implant prevents the ovaries from releasing an egg (ovulation), thickens the cervical mucus to prevent sperm from entering the uterus, and thins the lining of the inside of the uterus.  Progesterone-only injections. These injections are given every 3 months by your health care provider to prevent pregnancy. This synthetic progesterone hormone stops the ovaries from releasing eggs. It also thickens cervical mucus and changes the uterine lining. This makes it harder for sperm to survive in the uterus.  Birth control pills. These pills contain estrogen and progesterone hormone. They work by preventing the ovaries from releasing eggs (ovulation). They also cause the cervical mucus to thicken, preventing the sperm from entering the uterus. Birth control pills are prescribed by a health care provider.Birth control pills can also be used to treat heavy periods.  Minipill. This type of birth control pill contains only the progesterone hormone. They are taken every day of each month and must be prescribed by your health care provider.  Birth control patch. The patch contains hormones similar to those in birth control pills. It must be changed once a week and is prescribed by a health care provider.  Vaginal ring. The ring contains hormones similar to those in birth control pills. It is left in the vagina for 3 weeks, removed for 1 week, and then a new one is put back in place. The patient must be comfortable inserting and removing the ring  from the vagina.A health care provider's prescription is necessary.  Emergency contraception. Emergency contraceptives prevent pregnancy after unprotected sexual intercourse. This pill can be taken right after sex or up to 5 days after unprotected sex. It is most effective the sooner you take the pills after having sexual intercourse. Most emergency contraceptive pills are available without a prescription. Check with your pharmacist. Do not use emergency contraception as your only form of birth control. BARRIER METHODS   Female condom. This is a thin sheath (latex or rubber) that is worn over the penis during sexual intercourse. It can be used with spermicide to increase effectiveness.  Female condom. This is a soft, loose-fitting sheath that is put into the vagina before sexual intercourse.  Diaphragm. This is a soft, latex, dome-shaped barrier that must be fitted by a health care provider. It is inserted into the vagina, along with a spermicidal jelly. It is inserted before intercourse. The diaphragm should be left in the vagina for 6 to 8 hours after intercourse.  Cervical cap. This is a round, soft, latex or plastic cup that fits over the cervix and must be fitted by a health care provider. The cap can be left in place for up to 48 hours after intercourse.  Sponge. This is a soft, circular piece of polyurethane foam. The sponge has spermicide in it. It is inserted into the vagina after wetting it and before sexual intercourse.  Spermicides. These are chemicals that kill or block sperm from entering the cervix and uterus. They come in the form of creams, jellies, suppositories, foam, or tablets. They do not require a   prescription. They are inserted into the vagina with an applicator before having sexual intercourse. The process must be repeated every time you have sexual intercourse. INTRAUTERINE CONTRACEPTION  Intrauterine device (IUD). This is a T-shaped device that is put in a woman's uterus  during a menstrual period to prevent pregnancy. There are 2 types:  Copper IUD. This type of IUD is wrapped in copper wire and is placed inside the uterus. Copper makes the uterus and fallopian tubes produce a fluid that kills sperm. It can stay in place for 10 years.  Hormone IUD. This type of IUD contains the hormone progestin (synthetic progesterone). The hormone thickens the cervical mucus and prevents sperm from entering the uterus, and it also thins the uterine lining to prevent implantation of a fertilized egg. The hormone can weaken or kill the sperm that get into the uterus. It can stay in place for 3-5 years, depending on which type of IUD is used. PERMANENT METHODS OF CONTRACEPTION  Female tubal ligation. This is when the woman's fallopian tubes are surgically sealed, tied, or blocked to prevent the egg from traveling to the uterus.  Hysteroscopic sterilization. This involves placing a small coil or insert into each fallopian tube. Your doctor uses a technique called hysteroscopy to do the procedure. The device causes scar tissue to form. This results in permanent blockage of the fallopian tubes, so the sperm cannot fertilize the egg. It takes about 3 months after the procedure for the tubes to become blocked. You must use another form of birth control for these 3 months.  Female sterilization. This is when the female has the tubes that carry sperm tied off (vasectomy).This blocks sperm from entering the vagina during sexual intercourse. After the procedure, the man can still ejaculate fluid (semen). NATURAL PLANNING METHODS  Natural family planning. This is not having sexual intercourse or using a barrier method (condom, diaphragm, cervical cap) on days the woman could become pregnant.  Calendar method. This is keeping track of the length of each menstrual cycle and identifying when you are fertile.  Ovulation method. This is avoiding sexual intercourse during ovulation.  Symptothermal  method. This is avoiding sexual intercourse during ovulation, using a thermometer and ovulation symptoms.  Post-ovulation method. This is timing sexual intercourse after you have ovulated. Regardless of which type or method of contraception you choose, it is important that you use condoms to protect against the transmission of sexually transmitted infections (STIs). Talk with your health care provider about which form of contraception is most appropriate for you. Document Released: 12/21/2004 Document Revised: 12/26/2012 Document Reviewed: 06/15/2012 ExitCare Patient Information 2015 [employer]. This information is not intended to replace advice given to you by your health care provider. Make sure you discuss any questions you have with your health care provider.  

## 2013-07-10 NOTE — Progress Notes (Signed)
Dong well. Allergies resolved. Only uses Albuterol in winter. Not working. Advised walking and PN classes. Information on contraception given.

## 2013-07-10 NOTE — Progress Notes (Signed)
Edema-feet   

## 2013-08-08 ENCOUNTER — Ambulatory Visit (INDEPENDENT_AMBULATORY_CARE_PROVIDER_SITE_OTHER): Payer: Medicaid Other | Admitting: Family

## 2013-08-08 VITALS — BP 130/69 | HR 91 | Temp 98.3°F | Wt 231.1 lb

## 2013-08-08 DIAGNOSIS — Z3493 Encounter for supervision of normal pregnancy, unspecified, third trimester: Secondary | ICD-10-CM

## 2013-08-08 DIAGNOSIS — Z3480 Encounter for supervision of other normal pregnancy, unspecified trimester: Secondary | ICD-10-CM

## 2013-08-08 DIAGNOSIS — E669 Obesity, unspecified: Secondary | ICD-10-CM

## 2013-08-08 DIAGNOSIS — Z23 Encounter for immunization: Secondary | ICD-10-CM

## 2013-08-08 LAB — POCT URINALYSIS DIP (DEVICE)
Bilirubin Urine: NEGATIVE
Glucose, UA: NEGATIVE mg/dL
Hgb urine dipstick: NEGATIVE
Ketones, ur: NEGATIVE mg/dL
Nitrite: NEGATIVE
Protein, ur: 30 mg/dL — AB
Specific Gravity, Urine: 1.02 (ref 1.005–1.030)
Urobilinogen, UA: 1 mg/dL (ref 0.0–1.0)
pH: 6 (ref 5.0–8.0)

## 2013-08-08 MED ORDER — TETANUS-DIPHTH-ACELL PERTUSSIS 5-2.5-18.5 LF-MCG/0.5 IM SUSP: 0.5000 mL | Freq: Once | INTRAMUSCULAR | Status: DC

## 2013-08-08 NOTE — Progress Notes (Signed)
Reports occasional edema in hands and feet.  1hr gtt and labs today; to be drawn at 344.  Tdap today.

## 2013-08-08 NOTE — Progress Notes (Signed)
Doing well, no question or concerns.  Slight swelling of hands and feet > increase hydration.  Follow-up ultrasound ordered to reassess the heart.  Third trimester labs today.

## 2013-08-09 ENCOUNTER — Ambulatory Visit (HOSPITAL_COMMUNITY)
Admission: RE | Admit: 2013-08-09 | Discharge: 2013-08-09 | Disposition: A | Discharge status: Home / Self Care | Payer: Medicaid Other | Source: Ambulatory Visit | Attending: Family | Admitting: Family

## 2013-08-09 DIAGNOSIS — Z3689 Encounter for other specified antenatal screening: Secondary | ICD-10-CM | POA: Insufficient documentation

## 2013-08-09 DIAGNOSIS — Z3493 Encounter for supervision of normal pregnancy, unspecified, third trimester: Secondary | ICD-10-CM

## 2013-08-09 LAB — RPR

## 2013-08-09 LAB — GLUCOSE TOLERANCE, 1 HOUR (50G) W/O FASTING: Glucose, 1 Hour GTT: 104 mg/dL (ref 70–140)

## 2013-08-09 LAB — HIV ANTIBODY (ROUTINE TESTING W REFLEX): HIV 1&2 Ab, 4th Generation: NONREACTIVE

## 2013-08-10 ENCOUNTER — Encounter: Payer: Self-pay | Admitting: Family

## 2013-08-13 ENCOUNTER — Encounter: Payer: Self-pay | Admitting: Family

## 2013-08-29 ENCOUNTER — Encounter: Payer: Self-pay | Admitting: Obstetrics and Gynecology

## 2013-08-29 ENCOUNTER — Ambulatory Visit (INDEPENDENT_AMBULATORY_CARE_PROVIDER_SITE_OTHER): Payer: Medicaid Other | Admitting: Obstetrics and Gynecology

## 2013-08-29 VITALS — BP 132/83 | HR 89 | Wt 237.8 lb

## 2013-08-29 DIAGNOSIS — Z3480 Encounter for supervision of other normal pregnancy, unspecified trimester: Secondary | ICD-10-CM

## 2013-08-29 DIAGNOSIS — A499 Bacterial infection, unspecified: Secondary | ICD-10-CM

## 2013-08-29 DIAGNOSIS — Z3402 Encounter for supervision of normal first pregnancy, second trimester: Secondary | ICD-10-CM

## 2013-08-29 DIAGNOSIS — E669 Obesity, unspecified: Secondary | ICD-10-CM

## 2013-08-29 DIAGNOSIS — N760 Acute vaginitis: Secondary | ICD-10-CM

## 2013-08-29 DIAGNOSIS — Z3400 Encounter for supervision of normal first pregnancy, unspecified trimester: Secondary | ICD-10-CM

## 2013-08-29 DIAGNOSIS — B9689 Other specified bacterial agents as the cause of diseases classified elsewhere: Secondary | ICD-10-CM

## 2013-08-29 DIAGNOSIS — Z3493 Encounter for supervision of normal pregnancy, unspecified, third trimester: Secondary | ICD-10-CM

## 2013-08-29 LAB — POCT URINALYSIS DIP (DEVICE)
Bilirubin Urine: NEGATIVE
GLUCOSE, UA: NEGATIVE mg/dL
Hgb urine dipstick: NEGATIVE
Ketones, ur: NEGATIVE mg/dL
Nitrite: NEGATIVE
Protein, ur: NEGATIVE mg/dL
SPECIFIC GRAVITY, URINE: 1.025 (ref 1.005–1.030)
Urobilinogen, UA: 0.2 mg/dL (ref 0.0–1.0)
pH: 6 (ref 5.0–8.0)

## 2013-08-29 MED ORDER — PRENATAL PLUS 27-1 MG PO TABS: 1.0000 | ORAL_TABLET | Freq: Every day | ORAL | Status: DC

## 2013-08-29 NOTE — Patient Instructions (Signed)
Bacterial Vaginosis Bacterial vaginosis is a vaginal infection that occurs when the normal balance of bacteria in the vagina is disrupted. It results from an overgrowth of certain bacteria. This is the most common vaginal infection in women of childbearing age. Treatment is important to prevent complications, especially in pregnant women, as it can cause a premature delivery. CAUSES  Bacterial vaginosis is caused by an increase in harmful bacteria that are normally present in smaller amounts in the vagina. Several different kinds of bacteria can cause bacterial vaginosis. However, the reason that the condition develops is not fully understood. RISK FACTORS Certain activities or behaviors can put you at an increased risk of developing bacterial vaginosis, including:  Having a new sex partner or multiple sex partners.  Douching.  Using an intrauterine device (IUD) for contraception. Women do not get bacterial vaginosis from toilet seats, bedding, swimming pools, or contact with objects around them. SIGNS AND SYMPTOMS  Some women with bacterial vaginosis have no signs or symptoms. Common symptoms include:  Grey vaginal discharge.  A fishlike odor with discharge, especially after sexual intercourse.  Itching or burning of the vagina and vulva.  Burning or pain with urination. DIAGNOSIS  Your health care provider will take a medical history and examine the vagina for signs of bacterial vaginosis. A sample of vaginal fluid may be taken. Your health care provider will look at this sample under a microscope to check for bacteria and abnormal cells. A vaginal pH test may also be done.  TREATMENT  Bacterial vaginosis may be treated with antibiotic medicines. These may be given in the form of a pill or a vaginal cream. A second round of antibiotics may be prescribed if the condition comes back after treatment.  HOME CARE INSTRUCTIONS   Only take over-the-counter or prescription medicines as  directed by your health care provider.  If antibiotic medicine was prescribed, take it as directed. Make sure you finish it even if you start to feel better.  Do not have sex until treatment is completed.  Tell all sexual partners that you have a vaginal infection. They should see their health care provider and be treated if they have problems, such as a mild rash or itching.  Practice safe sex by using condoms and only having one sex partner. SEEK MEDICAL CARE IF:   Your symptoms are not improving after 3 days of treatment.  You have increased discharge or pain.  You have a fever. MAKE SURE YOU:   Understand these instructions.  Will watch your condition.  Will get help right away if you are not doing well or get worse. FOR MORE INFORMATION  Centers for Disease Control and Prevention, Division of STD Prevention: www.cdc.gov/std American Sexual Health Association (ASHA): www.ashastd.org  Document Released: 12/21/2004 Document Revised: 10/11/2012 Document Reviewed: 08/02/2012 ExitCare Patient Information 2015 [employer]. This information is not intended to replace advice given to you by your health care provider. Make sure you discuss any questions you have with your health care provider.  

## 2013-08-29 NOTE — Progress Notes (Signed)
Vits refilled. Malodorous vag discharge. WP sent. F/U US was nl.

## 2013-08-29 NOTE — Addendum Note (Signed)
Addended by: Aldona Lento on: 08/29/2013 02:59 PM   Modules accepted: Orders

## 2013-08-29 NOTE — Progress Notes (Signed)
Thinks she may have BV. Needs refill on PNV

## 2013-08-30 ENCOUNTER — Encounter (HOSPITAL_COMMUNITY): Payer: Self-pay | Admitting: *Deleted

## 2013-08-30 ENCOUNTER — Inpatient Hospital Stay (HOSPITAL_COMMUNITY)
Admission: AD | Admit: 2013-08-30 | Discharge: 2013-09-01 | DRG: 774 | Disposition: A | Discharge status: Home / Self Care | Payer: Medicaid Other | Source: Ambulatory Visit | Attending: Obstetrics & Gynecology | Admitting: Obstetrics & Gynecology

## 2013-08-30 ENCOUNTER — Inpatient Hospital Stay (HOSPITAL_COMMUNITY): Payer: Medicaid Other | Admitting: Anesthesiology

## 2013-08-30 ENCOUNTER — Inpatient Hospital Stay (HOSPITAL_COMMUNITY): Payer: Medicaid Other

## 2013-08-30 ENCOUNTER — Encounter (HOSPITAL_COMMUNITY): Payer: Medicaid Other | Admitting: Anesthesiology

## 2013-08-30 DIAGNOSIS — O4590 Premature separation of placenta, unspecified, unspecified trimester: Secondary | ICD-10-CM | POA: Diagnosis present

## 2013-08-30 DIAGNOSIS — Z6841 Body Mass Index (BMI) 40.0 and over, adult: Secondary | ICD-10-CM

## 2013-08-30 DIAGNOSIS — O9903 Anemia complicating the puerperium: Secondary | ICD-10-CM | POA: Diagnosis not present

## 2013-08-30 DIAGNOSIS — D649 Anemia, unspecified: Secondary | ICD-10-CM | POA: Diagnosis not present

## 2013-08-30 DIAGNOSIS — O4693 Antepartum hemorrhage, unspecified, third trimester: Secondary | ICD-10-CM

## 2013-08-30 DIAGNOSIS — O4690 Antepartum hemorrhage, unspecified, unspecified trimester: Secondary | ICD-10-CM | POA: Diagnosis present

## 2013-08-30 DIAGNOSIS — IMO0002 Reserved for concepts with insufficient information to code with codable children: Secondary | ICD-10-CM

## 2013-08-30 DIAGNOSIS — E669 Obesity, unspecified: Secondary | ICD-10-CM | POA: Diagnosis present

## 2013-08-30 DIAGNOSIS — O99210 Obesity complicating pregnancy, unspecified trimester: Secondary | ICD-10-CM

## 2013-08-30 DIAGNOSIS — O364XX0 Maternal care for intrauterine death, not applicable or unspecified: Secondary | ICD-10-CM | POA: Diagnosis present

## 2013-08-30 DIAGNOSIS — O4290 Premature rupture of membranes, unspecified as to length of time between rupture and onset of labor, unspecified weeks of gestation: Secondary | ICD-10-CM | POA: Diagnosis not present

## 2013-08-30 DIAGNOSIS — O99214 Obesity complicating childbirth: Secondary | ICD-10-CM

## 2013-08-30 LAB — COMPREHENSIVE METABOLIC PANEL
ALBUMIN: 2.9 g/dL — AB (ref 3.5–5.2)
ALK PHOS: 124 U/L — AB (ref 39–117)
ALT: 8 U/L (ref 0–35)
AST: 14 U/L (ref 0–37)
Anion gap: 14 (ref 5–15)
BILIRUBIN TOTAL: 0.2 mg/dL — AB (ref 0.3–1.2)
BUN: 6 mg/dL (ref 6–23)
CHLORIDE: 102 meq/L (ref 96–112)
CO2: 20 mEq/L (ref 19–32)
CREATININE: 0.75 mg/dL (ref 0.50–1.10)
Calcium: 8.8 mg/dL (ref 8.4–10.5)
GLUCOSE: 94 mg/dL (ref 70–99)
POTASSIUM: 3.8 meq/L (ref 3.7–5.3)
Sodium: 136 mEq/L — ABNORMAL LOW (ref 137–147)
Total Protein: 6.2 g/dL (ref 6.0–8.3)

## 2013-08-30 LAB — WET PREP, GENITAL
Clue Cells Wet Prep HPF POC: NONE SEEN
TRICH WET PREP: NONE SEEN
WBC, Wet Prep HPF POC: NONE SEEN
YEAST WET PREP: NONE SEEN

## 2013-08-30 LAB — ABO/RH: ABO/RH(D): B POS

## 2013-08-30 LAB — RAPID URINE DRUG SCREEN, HOSP PERFORMED
AMPHETAMINES: NOT DETECTED
Barbiturates: NOT DETECTED
Benzodiazepines: NOT DETECTED
COCAINE: NOT DETECTED
OPIATES: NOT DETECTED
Tetrahydrocannabinol: NOT DETECTED

## 2013-08-30 LAB — CBC
HEMATOCRIT: 33.3 % — AB (ref 36.0–46.0)
HEMOGLOBIN: 11.7 g/dL — AB (ref 12.0–15.0)
MCH: 32.3 pg (ref 26.0–34.0)
MCHC: 35.1 g/dL (ref 30.0–36.0)
MCV: 92 fL (ref 78.0–100.0)
Platelets: 186 10*3/uL (ref 150–400)
RBC: 3.62 MIL/uL — ABNORMAL LOW (ref 3.87–5.11)
RDW: 13.8 % (ref 11.5–15.5)
WBC: 22.4 10*3/uL — AB (ref 4.0–10.5)

## 2013-08-30 LAB — PROTIME-INR
INR: 1.01 (ref 0.00–1.49)
Prothrombin Time: 13.3 seconds (ref 11.6–15.2)

## 2013-08-30 LAB — PREPARE RBC (CROSSMATCH)

## 2013-08-30 LAB — FIBRINOGEN: Fibrinogen: 459 mg/dL (ref 204–475)

## 2013-08-30 LAB — APTT: APTT: 28 s (ref 24–37)

## 2013-08-30 MED ORDER — LACTATED RINGERS IV SOLN
500.0000 mL | Freq: Once | INTRAVENOUS | Status: AC
  Administered 2013-08-30: 500 mL via INTRAVENOUS

## 2013-08-30 MED ORDER — DIPHENHYDRAMINE HCL 50 MG/ML IJ SOLN: 12.5000 mg | INTRAMUSCULAR | Status: DC | PRN

## 2013-08-30 MED ORDER — LIDOCAINE HCL (PF) 1 % IJ SOLN
30.0000 mL | INTRAMUSCULAR | Status: DC | PRN
  Filled 2013-08-30: qty 30

## 2013-08-30 MED ORDER — LACTATED RINGERS IV SOLN
INTRAVENOUS | Status: DC
  Administered 2013-08-30 – 2013-08-31 (×4): via INTRAVENOUS

## 2013-08-30 MED ORDER — LIDOCAINE HCL (PF) 1 % IJ SOLN
INTRAMUSCULAR | Status: DC | PRN
  Administered 2013-08-30 (×4): 4 mL

## 2013-08-30 MED ORDER — OXYTOCIN 40 UNITS IN LACTATED RINGERS INFUSION - SIMPLE MED
62.5000 mL/h | INTRAVENOUS | Status: DC
  Filled 2013-08-30: qty 1000

## 2013-08-30 MED ORDER — PHENYLEPHRINE 40 MCG/ML (10ML) SYRINGE FOR IV PUSH (FOR BLOOD PRESSURE SUPPORT)
80.0000 ug | PREFILLED_SYRINGE | INTRAVENOUS | Status: DC | PRN
  Filled 2013-08-30: qty 2

## 2013-08-30 MED ORDER — CITRIC ACID-SODIUM CITRATE 334-500 MG/5ML PO SOLN: 30.0000 mL | ORAL | Status: DC | PRN

## 2013-08-30 MED ORDER — ONDANSETRON HCL 4 MG/2ML IJ SOLN
4.0000 mg | Freq: Four times a day (QID) | INTRAMUSCULAR | Status: DC | PRN
Start: 2013-08-30 — End: 2013-09-01

## 2013-08-30 MED ORDER — MISOPROSTOL 25 MCG QUARTER TABLET
25.0000 ug | ORAL_TABLET | ORAL | Status: DC
  Filled 2013-08-30: qty 0.25

## 2013-08-30 MED ORDER — IBUPROFEN 600 MG PO TABS: 600.0000 mg | ORAL_TABLET | Freq: Four times a day (QID) | ORAL | Status: DC | PRN

## 2013-08-30 MED ORDER — FAMOTIDINE IN NACL 20-0.9 MG/50ML-% IV SOLN: 20.0000 mg | Freq: Once | INTRAVENOUS | Status: DC

## 2013-08-30 MED ORDER — OXYCODONE-ACETAMINOPHEN 5-325 MG PO TABS: 1.0000 | ORAL_TABLET | ORAL | Status: DC | PRN

## 2013-08-30 MED ORDER — PHENYLEPHRINE 40 MCG/ML (10ML) SYRINGE FOR IV PUSH (FOR BLOOD PRESSURE SUPPORT)
80.0000 ug | PREFILLED_SYRINGE | INTRAVENOUS | Status: DC | PRN
  Administered 2013-08-30 (×2): 80 ug via INTRAVENOUS
  Filled 2013-08-30: qty 10
  Filled 2013-08-30: qty 2

## 2013-08-30 MED ORDER — SODIUM CHLORIDE 0.9 % IV SOLN: Freq: Once | INTRAVENOUS | Status: DC

## 2013-08-30 MED ORDER — FENTANYL CITRATE 0.05 MG/ML IJ SOLN: 100.0000 ug | Freq: Once | INTRAMUSCULAR | Status: DC

## 2013-08-30 MED ORDER — LACTATED RINGERS IV SOLN: 500.0000 mL | INTRAVENOUS | Status: DC | PRN

## 2013-08-30 MED ORDER — OXYTOCIN BOLUS FROM INFUSION
500.0000 mL | INTRAVENOUS | Status: DC
  Administered 2013-08-31: 500 mL via INTRAVENOUS

## 2013-08-30 MED ORDER — ACETAMINOPHEN 325 MG PO TABS
650.0000 mg | ORAL_TABLET | ORAL | Status: DC | PRN
  Administered 2013-08-31: 650 mg via ORAL
  Filled 2013-08-30: qty 2

## 2013-08-30 MED ORDER — CITRIC ACID-SODIUM CITRATE 334-500 MG/5ML PO SOLN: 30.0000 mL | Freq: Once | ORAL | Status: DC

## 2013-08-30 MED ORDER — FENTANYL 2.5 MCG/ML BUPIVACAINE 1/10 % EPIDURAL INFUSION (WH - ANES)
14.0000 mL/h | INTRAMUSCULAR | Status: DC | PRN
  Administered 2013-08-30 – 2013-08-31 (×4): 14 mL/h via EPIDURAL
  Filled 2013-08-30 (×4): qty 125

## 2013-08-30 MED ORDER — EPHEDRINE 5 MG/ML INJ
10.0000 mg | INTRAVENOUS | Status: DC | PRN
  Filled 2013-08-30: qty 2

## 2013-08-30 MED ORDER — MISOPROSTOL 100 MCG PO TABS
100.0000 ug | ORAL_TABLET | ORAL | Status: DC | PRN
  Administered 2013-08-30 – 2013-08-31 (×3): 100 ug via VAGINAL
  Filled 2013-08-30 (×3): qty 1

## 2013-08-30 MED ORDER — CITRIC ACID-SODIUM CITRATE 334-500 MG/5ML PO SOLN
ORAL | Status: AC
  Filled 2013-08-30: qty 15

## 2013-08-30 NOTE — Progress Notes (Signed)
Dr. Rodman Pickle notified of patient's decreased blood pressure post epidural placement, nursing interventions and resolution of blood pressure to patient's baseline. No new orders received.

## 2013-08-30 NOTE — Progress Notes (Signed)
Fedra Lanter is a 28 y.o. G1P0000 at [redacted]w[redacted]d admitted for induction of labor due to IUFD and vaginal bleeding.  Subjective: Pt comfortable with epidural.  Family in room for support.  Objective: BP 117/62  Pulse 79  Temp(Src) 99.4 F (37.4 C) (Oral)  Resp 20  Ht  (1.626 m)  Wt 107.502 kg (237 lb)  BMI 40.66 kg/m2  SpO2 100%  LMP 01/22/2013      SVE:   Dilation: 1 Effacement (%): Thick Station: -3 Exam by:: TWillis RNC  Labs: Lab Results  Component Value Date   WBC 22.4* 08/30/2013   HGB 11.7* 08/30/2013   HCT 33.3* 08/30/2013   MCV 92.0 08/30/2013   PLT 186 08/30/2013    Assessment / Plan: Induction of labor due to fetal demise  Labor: Cytotec 25 mcg pv x1 dose. Apply toco. Consider increasing Cytotec dose to 50 mcg in 4 hours if contractions inadequate < 2/10 minutes.   Preeclampsia:  n/a  Pain Control:  Epidural I/D:  n/a Anticipated MOD:  SVD  LEFTWICH-KIRBY, Jordy Verba 08/30/2013, 7:56 PM

## 2013-08-30 NOTE — Progress Notes (Signed)
   Monique Cardenas is a 28 y.o. G1P0000 at [redacted]w[redacted]d  admitted for induction of labor due to IUFD.  Subjective:  confortable with epidural Objective: Filed Vitals:   08/30/13 2032 08/30/13 2101 08/30/13 2106 08/30/13 2202  BP: 119/64 115/66  102/47  Pulse: 83 85 92 94  Temp:    99.9 F (37.7 C)  TempSrc:    Oral  Resp:      Height:      Weight:      SpO2:   100%    Total I/O In: -  Out: 1000 [Urine:1000]  Cx 1-2/50/-2 per Dr. Joyner  Labs: Lab Results  Component Value Date   WBC 22.4* 08/30/2013   HGSouthwestChattanooga Pain Management [facility] LLC [facility] HeaNortonMarylanBarnDiVirginia Mason Memorial HospitalgestBellMarylanBarnParkvPam Rehabilitation Hospital Of Alleniew SykesMarylanBarnHalifaUniversity Hospitals Conneaut Medical Centerx HeNicMarylanBarnUniveAdc Surgicenter, LLC Dba Austin Diagnostic ClinicrsitBroadMarylanBarnNorthside Hospital - CherokeeLoweMarylanBarnHoNiobrara Health And LifWartburg Surgery Centere CeMarylanBarnLos AngelOphthalmology Surgery Center Of Dallas LLCes CDarliMarylanBarnMinden FamilOrange Regional Medical CenRegional West MedicalVa Southern [facility] System CenSurfside Tresanti SurgicWomen'S & Children'S Hospitalal CThorMarylanBaGreeley CouDeborah Heart And Lung CenVision CarBeltway Surgery Centers Dba Saxony Surgery Centere OfNolensMarylanBarnAscension MacoSpalding Rehabilitation HospitalmbConey Island Hospital OPoPasadenaMarylanBarnLake Taylor Transitional Care HospitPhahttpsGracie [facility]://wGreenfMarylanBarnJohn T MatheFultonSheepshead BAdventhealth Searchlight Chapelay SBrianMarylanBarnWildcreek Devereux Childrens Behavioral HeMethodist Medical Center Asc LPalthBowlSsm Health Cardinal Glennon Children'S Medical Centering BrasMarylanBarnLegeSuncoast Specialty Surgery Center LlLPnt HMoosMonroe County SurgHollandRestpadd [facility] Facility EyeMaverick MouMarylanBarnFlaget Memorial [email or web address] [email or web address] 

## 2013-08-30 NOTE — MAU Note (Signed)
Two family members @ bedside, pt crying, comfort measures offered, pt states she does not want to speak with chaplain @ this time.

## 2013-08-30 NOTE — Progress Notes (Signed)
08/30/13 1500  Clinical Encounter Type  Visited With Patient and family together (mom Monique Cardenas and SO)  Visit Type Spiritual support;Social support (IUFD)  Referral From Nurse Ninfa Meeker, RN)  Spiritual Encounters  Spiritual Needs Grief support;Emotional  Stress Factors  Patient Stress Factors Loss  Family Stress Factors Loss   Visited with Monique Cardenas, her SO, and her mom to introduce Spiritual Care for emotional/spiritual/bereavement support.  Monique Cardenas is understandably stunned, withdrawn, and quiet in this initial stage of shock and loss.  Her SO expressed appreciation for visit and for offer of ongoing support.  At this time family desires quiet privacy to process and adjust.  [facility] will follow, but please page 24/7 as needs develop.  Thank you.  [address] [place], [place] [phone]

## 2013-08-30 NOTE — MAU Note (Signed)
U/S tech here for Bedside U/S.

## 2013-08-30 NOTE — MAU Note (Signed)
Peri care done, small-moderate amount of bleeding note.

## 2013-08-30 NOTE — H&P (Signed)
Monique Cardenas is a 28 y.o. female G1P0000  pt of [facility] presenting via EMS for rupture of membranes and vaginal bleeding. She felt a gush of fluid at 1:30 pm.  Upon arrival in MAU, audible heart rate 60-80, unsure if fetal bradycardia or tracing of MHR. IV started, labs drawn, started consent for C/S.  Within a few minutes, Dr Penne Lash at bedside with ultrasound, no fetal heart rate detected at that time.  Formal ultrasound confirmed IUFD.  Pt reports last fetal movement just before feeling fluid leakage.  She denies contractions, abdominal pain, urinary symptoms, h/a, dizziness, n/v, or fever/chills.    Maternal Medical History:  Reason for admission: Vaginal bleeding.  Nausea. IUFD   Prenatal complications: no prenatal complications Prenatal Complications - Diabetes: none.    OB History   Grav Para Term Preterm Abortions TAB SAB Ect Mult Living       Past Medical History  Diagnosis Date  . Asthma    Past Surgical History  Procedure Laterality Date  . No past surgeries     Family History: family history is negative for Other. Social History:  reports that she has never smoked. She has never used smokeless tobacco. She reports that she does not drink alcohol or use illicit drugs.   Prenatal Transfer Tool  N/A IUFD  Review of Systems  Constitutional: Negative for fever, chills and malaise/fatigue.  Eyes: Negative for blurred vision.  Respiratory: Negative for cough and shortness of breath.   Cardiovascular: Negative for chest pain.  Gastrointestinal: Negative for heartburn, nausea, vomiting and abdominal pain.  Genitourinary: Negative for dysuria, urgency and frequency.  Musculoskeletal: Negative.   Neurological: Negative for dizziness and headaches.  Psychiatric/Behavioral: Negative for depression.      Blood pressure 115/71, pulse 108, temperature 98.4 F (36.9 C), temperature source Oral, resp. rate 16, height  (1.626 m), weight 107.502 kg  (237 lb), last menstrual period 01/22/2013, SpO2 100.00%. Maternal Exam:  Uterine Assessment: Contraction frequency is rare.   Abdomen: Patient reports no abdominal tenderness. Fetal presentation: vertex  Introitus: Amniotic fluid character: bloody.  Cervix: Cervix evaluated by digital exam.     Physical Exam  Nursing note and vitals reviewed. Constitutional: She is oriented to person, place, and time. She appears well-developed and well-nourished.  Neck: Normal range of motion.  Cardiovascular: Normal rate and regular rhythm.   Respiratory: Effort normal.  GI: Soft.  Musculoskeletal: Normal range of motion.  Neurological: She is alert and oriented to person, place, and time.  Skin: Skin is warm and dry.  Psychiatric: She has a normal mood and affect. Her behavior is normal. Judgment and thought content normal.    Prenatal labs: ABO, Rh: --/--/B POS (08/27 1410) Antibody: PENDING (08/27 1410) Rubella: 3.68 (05/12 1623) RPR: NON REAC (08/05 1710)  HBsAg: NEGATIVE (05/12 1623)  HIV: NONREACTIVE (08/05 1710)  GBS:   n/a  Assessment/Plan: G1P0000  with IUFD Vaginal bleeding  Admit to [facility] Coagulation labs UDS IOL with Cytotec pv Epidural when desired Anticipate vaginal delivery  Monique Cardenas, Monique Cardenas 08/30/2013, 3:15 PM

## 2013-08-30 NOTE — MAU Note (Signed)
Dr. Penne Lash @ bedside, doing U/S, IV started, Dr. Rodman Pickle drawing blood.

## 2013-08-30 NOTE — Anesthesia Preprocedure Evaluation (Signed)
Anesthesia Evaluation  Patient identified by MRN, date of birth, ID band Patient awake    Reviewed: Allergy & Precautions, H&P , NPO status , Patient's Chart, lab work & pertinent test results, reviewed documented beta blocker date and time   History of Anesthesia Complications Negative for: history of anesthetic complications  Airway Mallampati: III TM Distance: >3 FB Neck ROM: full    Dental  (+) Teeth Intact   Pulmonary asthma (seasonal) ,  breath sounds clear to auscultation        Cardiovascular negative cardio ROS  Rhythm:regular Rate:Normal     Neuro/Psych negative neurological ROS  negative psych ROS   GI/Hepatic negative GI ROS, Neg liver ROS,   Endo/Other  Morbid obesity  Renal/GU negative Renal ROS     Musculoskeletal   Abdominal   Peds  Hematology negative hematology ROS (+)   Anesthesia Other Findings   Reproductive/Obstetrics (+) Pregnancy (IUFD at 31 weeks)                           Anesthesia Physical Anesthesia Plan  ASA: III  Anesthesia Plan: Epidural   Post-op Pain Management:    Induction:   Airway Management Planned:   Additional Equipment:   Intra-op Plan:   Post-operative Plan:   Informed Consent: I have reviewed the patients History and Physical, chart, labs and discussed the procedure including the risks, benefits and alternatives for the proposed anesthesia with the patient or authorized representative who has indicated his/her understanding and acceptance.     Plan Discussed with:   Anesthesia Plan Comments:         Anesthesia Quick Evaluation

## 2013-08-30 NOTE — Progress Notes (Signed)
1737- L. Courtney Paris, CNM notified of patient admission status, amt of EBL prior to SVE est. 100 cc on pad, SVE, uterine palpation and UC freq, requesting POC, Provider on way to assess.

## 2013-08-30 NOTE — MAU Note (Signed)
No FHR noted on U/S, Dr. Penne Lash @ bedside talking to pt & her family.

## 2013-08-30 NOTE — Progress Notes (Signed)
Chaplain visit is follow-up to earlier spiritual care. Ms Allington was open to the spiritual care visit and was open about her emotional state. She has still not come to grips with the reality of the loss of her first child. She is in the early stages of grief and is being hindered in the grief process by friends and family telling her to be strong. She feels she has to be the strong one while her SO and family are weaken by the death of her child. When she tries to allow the emotions of grief to surface they are pushed down by her feelings of having to be strong. There seems to be a minor disparity in what Ms Mcelmurry wishes in Spiritual Care and support. Her family seems to wish that she not be seen by chaplains, so that she can "rest", but she indicates a desire for holistic care (body, mind, spirit) and is grateful for chaplain visits earlier and tonight, because she is allowed to step away from having to be mentally and spiritually strong while she is with a chaplain or with other staff members while others in her family are not present. She worries how her parents are reacting and at present is not comfortable with discussing her honest feelings with them. Because her SO is so torn about the death of their child, discussions with him appear too sensitive at present. This leaves Ms Strzelecki in a poor emotional and spiritual state that may become overwhelming when she gives birth to her child and sees the child. Care should be taken to allow her the space - with or without others present - to grieve in her own way. She says she is holding back tears. She should be encouraged to weep as the emotion comes upon her to do so.  RECOMMEND: Intentional Chaplain follow-up early on 28 August, or when she wishes spiritual/emotional support.  Benjie Karvonen. Safir Michalec, DMin Chaplain

## 2013-08-30 NOTE — MAU Note (Signed)
Rush Barer, chaplain in to speak with pt & her family.

## 2013-08-30 NOTE — MAU Note (Signed)
Pt came in by EMS with C/O her water breaking at the HD @ 1300, states her cervix was 1 cm there.  Currently having heavy bleeding, L. Leftwich Craige Cotta CNM informed, came immediately to bedside.

## 2013-08-30 NOTE — H&P (Signed)
Attestation of Attending Supervision of Advanced Practitioner (PA/CNM/NP): Evaluation and management procedures were performed by the Advanced Practitioner under my supervision and collaboration.  I have reviewed the Advanced Practitioner's note and chart, and I agree with the management and plan.  Summerlyn Fickel, MD, FACOG Attending Obstetrician & Gynecologist Faculty Practice, [facility]   

## 2013-08-30 NOTE — Progress Notes (Addendum)
FHR ? In 60's, EFM off for sonosite U/S by MD.

## 2013-08-30 NOTE — Anesthesia Procedure Notes (Signed)
Epidural Patient location during procedure: OB Start time: 08/30/2013 4:13 PM  Staffing Performed by: anesthesiologist   Preanesthetic Checklist Completed: patient identified, site marked, surgical consent, pre-op evaluation, timeout performed, IV checked, risks and benefits discussed and monitors and equipment checked  Epidural Patient position: sitting Prep: site prepped and draped and DuraPrep Patient monitoring: continuous pulse ox and blood pressure Approach: midline Location: L3-L4 Injection technique: LOR air  Needle:  Needle type: Tuohy  Needle gauge: 17 G Needle length: 9 cm and 9 Needle insertion depth: 7 cm Catheter type: closed end flexible Catheter size: 19 Gauge Catheter at skin depth: 12 cm Test dose: negative  Assessment Events: blood not aspirated, injection not painful, no injection resistance, negative IV test and no paresthesia  Additional Notes Discussed risk of headache, infection, bleeding, nerve injury and failed or incomplete block.  Patient voices understanding and wishes to proceed.  Epidural placed easily on first attempt.  No paresthesia.  Patient tolerated procedure well with no apparent complications.  Jasmine December, MDReason for block:procedure for pain

## 2013-08-31 ENCOUNTER — Encounter (HOSPITAL_COMMUNITY): Payer: Self-pay | Admitting: *Deleted

## 2013-08-31 DIAGNOSIS — O4290 Premature rupture of membranes, unspecified as to length of time between rupture and onset of labor, unspecified weeks of gestation: Secondary | ICD-10-CM

## 2013-08-31 DIAGNOSIS — O364XX0 Maternal care for intrauterine death, not applicable or unspecified: Secondary | ICD-10-CM

## 2013-08-31 LAB — CBC
HEMATOCRIT: 25.2 % — AB (ref 36.0–46.0)
Hemoglobin: 8.6 g/dL — ABNORMAL LOW (ref 12.0–15.0)
MCH: 31.5 pg (ref 26.0–34.0)
MCHC: 34.1 g/dL (ref 30.0–36.0)
MCV: 92.3 fL (ref 78.0–100.0)
Platelets: 150 10*3/uL (ref 150–400)
RBC: 2.73 MIL/uL — AB (ref 3.87–5.11)
RDW: 13.9 % (ref 11.5–15.5)
WBC: 16.6 10*3/uL — ABNORMAL HIGH (ref 4.0–10.5)

## 2013-08-31 LAB — DIC (DISSEMINATED INTRAVASCULAR COAGULATION) PANEL
D DIMER QUANT: 4.17 ug{FEU}/mL — AB (ref 0.00–0.48)
INR: 1.04 (ref 0.00–1.49)
PLATELETS: 149 10*3/uL — AB (ref 150–400)

## 2013-08-31 LAB — RPR

## 2013-08-31 LAB — DIC (DISSEMINATED INTRAVASCULAR COAGULATION)PANEL
Fibrinogen: 382 mg/dL (ref 204–475)
Prothrombin Time: 13.6 seconds (ref 11.6–15.2)
Smear Review: NONE SEEN
aPTT: 29 seconds (ref 24–37)

## 2013-08-31 MED ORDER — DIPHENHYDRAMINE HCL 25 MG PO CAPS: 25.0000 mg | ORAL_CAPSULE | Freq: Four times a day (QID) | ORAL | Status: DC | PRN

## 2013-08-31 MED ORDER — SENNOSIDES-DOCUSATE SODIUM 8.6-50 MG PO TABS
2.0000 | ORAL_TABLET | ORAL | Status: DC
  Administered 2013-08-31: 2 via ORAL
  Filled 2013-08-31: qty 2

## 2013-08-31 MED ORDER — OXYTOCIN 40 UNITS IN LACTATED RINGERS INFUSION - SIMPLE MED
1.0000 m[IU]/min | INTRAVENOUS | Status: DC
  Administered 2013-08-31: 2 m[IU]/min via INTRAVENOUS

## 2013-08-31 MED ORDER — ONDANSETRON HCL 4 MG/2ML IJ SOLN: 4.0000 mg | INTRAMUSCULAR | Status: DC | PRN

## 2013-08-31 MED ORDER — PNEUMOCOCCAL VAC POLYVALENT 25 MCG/0.5ML IJ INJ
0.5000 mL | INJECTION | INTRAMUSCULAR | Status: DC
  Filled 2013-08-31: qty 0.5

## 2013-08-31 MED ORDER — ONDANSETRON HCL 4 MG PO TABS: 4.0000 mg | ORAL_TABLET | ORAL | Status: DC | PRN

## 2013-08-31 MED ORDER — ZOLPIDEM TARTRATE 5 MG PO TABS: 5.0000 mg | ORAL_TABLET | Freq: Every evening | ORAL | Status: DC | PRN

## 2013-08-31 MED ORDER — TETANUS-DIPHTH-ACELL PERTUSSIS 5-2.5-18.5 LF-MCG/0.5 IM SUSP: 0.5000 mL | Freq: Once | INTRAMUSCULAR | Status: DC

## 2013-08-31 MED ORDER — WITCH HAZEL-GLYCERIN EX PADS: 1.0000 "application " | MEDICATED_PAD | CUTANEOUS | Status: DC | PRN

## 2013-08-31 MED ORDER — LANOLIN HYDROUS EX OINT: TOPICAL_OINTMENT | CUTANEOUS | Status: DC | PRN

## 2013-08-31 MED ORDER — SIMETHICONE 80 MG PO CHEW: 80.0000 mg | CHEWABLE_TABLET | ORAL | Status: DC | PRN

## 2013-08-31 MED ORDER — IBUPROFEN 600 MG PO TABS
600.0000 mg | ORAL_TABLET | Freq: Four times a day (QID) | ORAL | Status: DC
  Administered 2013-08-31 – 2013-09-01 (×2): 600 mg via ORAL
  Filled 2013-08-31 (×2): qty 1

## 2013-08-31 MED ORDER — OXYCODONE-ACETAMINOPHEN 5-325 MG PO TABS: 1.0000 | ORAL_TABLET | ORAL | Status: DC | PRN

## 2013-08-31 MED ORDER — PROMETHAZINE HCL 25 MG/ML IJ SOLN
25.0000 mg | Freq: Four times a day (QID) | INTRAMUSCULAR | Status: DC | PRN
  Administered 2013-08-31: 25 mg via INTRAVENOUS
  Filled 2013-08-31: qty 1

## 2013-08-31 MED ORDER — DIBUCAINE 1 % RE OINT: 1.0000 "application " | TOPICAL_OINTMENT | RECTAL | Status: DC | PRN

## 2013-08-31 MED ORDER — BENZOCAINE-MENTHOL 20-0.5 % EX AERO
1.0000 "application " | INHALATION_SPRAY | CUTANEOUS | Status: DC | PRN
  Filled 2013-08-31: qty 56

## 2013-08-31 MED ORDER — PRENATAL MULTIVITAMIN CH: 1.0000 | ORAL_TABLET | Freq: Every day | ORAL | Status: DC

## 2013-08-31 NOTE — Progress Notes (Signed)
08/31/13 1500  Clinical Encounter Type  Visited With Patient and family together (close friend Deidra)  Visit Type Follow-up;Spiritual support;Social support (bereavement support)  Spiritual Encounters  Spiritual Needs Grief support;Emotional   Followed up with Tye twice this afternoon to provide further emotional and spiritual support.  Her friend Deidra asked many thoughtful questions about grief process, bereavement, and counseling/support resources, providing helpful and meaningful lauchpad for reflective conversation.  Pt and friend appreciative of support, witness, affirmation of emotional process and its complexity.  Suspended visit when MD came to break water; plan to visit again before 5pm for further support as labor may be progressing.  Chaplain Charlie Lumpkin, whom Joy met last night, will be on call through the weekend; please page [phone] for Spiritual Care visit at any time.  Thank you.  [place], [place]

## 2013-08-31 NOTE — Progress Notes (Signed)
LABOR PROGRESS NOTE  Monique Cardenas is a 28 y.o. G1P0000 at [redacted]w[redacted]d  admitted for induction of labor due to IUFD.  Subjective: Comfortable, no complaints.  Objective: BP 137/80  Pulse 85  Temp(Src) 98.8 F (37.1 C) (Oral)  Resp 20  Ht  (1.626 m)  Wt 237 lb (107.502 kg)  BMI 40.66 kg/m2  SpO2 100%  LMP 01/22/2013 or  Filed Vitals:   08/31/13 1301 08/31/13 1331 08/31/13 1401 08/31/13 1431  BP: 133/83 126/73 124/74 137/80  Pulse: 84 79 79 85  Temp: 98.8 F (37.1 C)     TempSrc: Oral     Resp: Height:      Weight:      SpO2:        Total I/O In: -  Out: 1525 [Urine:1525] SVE:   Dilation: 4 Effacement (%): 90 Station: -2 Exam by:: Marquavion Venhuizen  Dilation: 4 Effacement (%): 90 Cervical Position: Middle Station: -2 Presentation: Vertex Exam by:: Twisha Vanpelt  Labs: Lab Results  Component Value Date   WBC 22.4* 08/30/2013   HGB 11.7* 08/30/2013   HCT 33.3* 08/30/2013   MCV 92.0 08/30/2013   PLT 149* 08/30/2013    Assessment / Plan: Induction of labor 2/2 IUFD, continue pitocin  Labor: Progressing normally, AROM minimal fluid return with blood Pain Control:  Epidural Anticipated MOD:  Vaginal delivery  Follow up CBC pending  Benjaman Artman ROCIO, MD 08/31/2013, 3:05 PM

## 2013-08-31 NOTE — Progress Notes (Signed)
   Monique Cardenas is a 28 y.o. G1P0000 at [redacted]w[redacted]d  admitted for induction of labor due to IUFD.  Subjective: Confortable with epidural; Pt sleeping  Objective: Filed Vitals:   08/31/13 0132 08/31/13 0202 08/31/13 0226 08/31/13 0231  BP: 119/70 126/64  116/63  Pulse: 86 87  81  Temp:   99.6 F (37.6 C)   TempSrc:   Axillary   Resp:      Height:      Weight:      SpO2:       Total I/O In: -  Out: 1000 [Urine:1000] Dilation: 1.5 Effacement (%): 80 Cervical Position: Middle Station: -2;-3 Presentation: Vertex Exam by:: ansah-mensah, rnc    Labs: Lab Results  Component Value Date   WBC 22.4* 08/30/2013   HGB 11.7* 08/30/2013   HCT 33.3* 08/30/2013   MCV 92.0 08/30/2013   PLT 149* 08/30/2013    Assessment / Plan: IOL for IUFD; cytotec PV  q 4 hours  Wenda Low 08/31/2013, 3:57 AM

## 2013-08-31 NOTE — Progress Notes (Signed)
Follow up visit to earlier spiritual care from Daytime chaplains.  Monique Cardenas is still withholding grief in order to "keep it together." That said her grief has progressed well from the pent up emotions of a day ago. Family is being as supportive as much as they can, Grief counsel focused on allowing grief to progress at a pace comfortable to Monique Cardenas, rather than at a pace - slow or fast - others set for her. She acknowledges that this grief process will take a long time.  At this writing she has not decided on a funeral home to handle cremation of her son, Monique Cardenas. She may need to look at a list of local funeral homes to pick one that is appropriate for her emotional, spiritual and financial needs.  Monique Cardenas indicated that she may try to get pregnant in the future, but at present she is focusing on grieving the death of [name].  Monique Cardenas indicated her appreciation of the care given her and [name] while she has been in [facility]. She voiced appreciation for the support provided by nurses, doctors and chaplains, in providing care to her during this time of grief and loss.  Chaplain available at any time to visit and assist Monique Cardenas in any matter of spiritual or emotional care.  Benjie Karvonen. Kiwan Gadsden, DMin Chaplain

## 2013-08-31 NOTE — Progress Notes (Signed)
LABOR PROGRESS NOTE  Monique Cardenas is a 28 y.o. G1P0000 at [redacted]w[redacted]d  admitted for induction of labor due to IUFD.  Subjective: Comfortable, no complaints.  Objective: BP 129/77  Pulse 88  Temp(Src) 98.6 F (37 C) (Oral)  Resp 20  Ht  (1.626 m)  Wt 237 lb (107.502 kg)  BMI 40.66 kg/m2  SpO2 100%  LMP 01/22/2013 or  Filed Vitals:   08/31/13 0901 08/31/13 0931 08/31/13 1001 08/31/13 1031  BP: 129/68 135/57 124/66 129/77  Pulse: 86 101 79 88  Temp:      TempSrc:      Resp:  20    Height:      Weight:      SpO2:          SVE:   Dilation: 3 Effacement (%): 80 Station: -2 Exam by:: Monique Cardenas  Dilation: 3 Effacement (%): 80 Cervical Position: Middle Station: -2 Presentation: Vertex Exam by:: Monique Cardenas  Labs: Lab Results  Component Value Date   WBC 22.4* 08/30/2013   HGB 11.7* 08/30/2013   HCT 33.3* 08/30/2013   MCV 92.0 08/30/2013   PLT 149* 08/30/2013    Assessment / Plan: Induction of labor 2/2 IUFD, will start pitocin  Labor: Progressing normally Pain Control:  Epidural Anticipated MOD:  Vaginal delivery   Monique Cardenas ROCIO, MD 08/31/2013, 10:43 AM

## 2013-08-31 NOTE — Progress Notes (Signed)
08/31/13 1700  Clinical Encounter Type  Visited With Patient and family together (parents Merlyn Albert and Optometrist)  Visit Type Follow-up  Spiritual Encounters  Spiritual Needs Grief support   Provided pastoral presence and bereavement support after delivery.  Will also refer to pm/WE chaplain for further spiritual care.  [address] [place], [place] [phone]

## 2013-09-01 ENCOUNTER — Encounter (HOSPITAL_COMMUNITY): Payer: Self-pay | Admitting: *Deleted

## 2013-09-01 LAB — DIC (DISSEMINATED INTRAVASCULAR COAGULATION)PANEL
D-Dimer, Quant: 0.93 ug/mL-FEU — ABNORMAL HIGH (ref 0.00–0.48)
Platelets: 169 10*3/uL (ref 150–400)
Smear Review: NONE SEEN

## 2013-09-01 LAB — DIC (DISSEMINATED INTRAVASCULAR COAGULATION) PANEL
APTT: 35 s (ref 24–37)
Fibrinogen: 447 mg/dL (ref 204–475)
INR: 0.97 (ref 0.00–1.49)
PROTHROMBIN TIME: 12.9 s (ref 11.6–15.2)

## 2013-09-01 LAB — TYPE AND SCREEN
ABO/RH(D): B POS
Antibody Screen: NEGATIVE
UNIT DIVISION: 0
Unit division: 0

## 2013-09-01 LAB — CBC
HCT: 24 % — ABNORMAL LOW (ref 36.0–46.0)
HCT: 24.7 % — ABNORMAL LOW (ref 36.0–46.0)
HEMOGLOBIN: 8.2 g/dL — AB (ref 12.0–15.0)
Hemoglobin: 8.6 g/dL — ABNORMAL LOW (ref 12.0–15.0)
MCH: 31.8 pg (ref 26.0–34.0)
MCH: 32.2 pg (ref 26.0–34.0)
MCHC: 34.2 g/dL (ref 30.0–36.0)
MCHC: 34.8 g/dL (ref 30.0–36.0)
MCV: 92.5 fL (ref 78.0–100.0)
MCV: 93 fL (ref 78.0–100.0)
PLATELETS: 169 10*3/uL (ref 150–400)
Platelets: 166 10*3/uL (ref 150–400)
RBC: 2.58 MIL/uL — ABNORMAL LOW (ref 3.87–5.11)
RBC: 2.67 MIL/uL — ABNORMAL LOW (ref 3.87–5.11)
RDW: 14.1 % (ref 11.5–15.5)
RDW: 13.9 % (ref 11.5–15.5)
WBC: 18.3 10*3/uL — ABNORMAL HIGH (ref 4.0–10.5)
WBC: 20.8 10*3/uL — AB (ref 4.0–10.5)

## 2013-09-01 MED ORDER — FERROUS SULFATE 325 (65 FE) MG PO TABS: 325.0000 mg | ORAL_TABLET | Freq: Two times a day (BID) | ORAL | Status: DC

## 2013-09-01 MED ORDER — IBUPROFEN 600 MG PO TABS: 600.0000 mg | ORAL_TABLET | Freq: Four times a day (QID) | ORAL | Status: DC | PRN

## 2013-09-01 NOTE — Discharge Summary (Signed)
Obstetric Discharge Summary Reason for Admission: rupture of membranes & bleeding, IUFD Prenatal Procedures: none Intrapartum Procedures: spontaneous vaginal delivery Postpartum Procedures: none Complications-Operative and Postpartum: none Hemoglobin  Date Value Ref Range Status  09/01/2013 8.2* 12.0 - 15.0 g/dL Final     HCT  Date Value Ref Range Status  09/01/2013 24.0* 36.0 - 46.0 % Final   Monique Cardenas is a 28yo G1 who came in via EMS @ 31.4wks on the afternoon of 08/30/13 with leaking fluid/bleeding. Upon arrival in MAU there were possible FHTs heard in the 60-80s, but using U/S there were no FHTs noted. She was admitted to L&D and the induction process was explained and started with cytotec and then progressing to Pitocin.  She progressed to SVD in the late afternoon of 8/28. The chaplain service offered support via frequent visits. All coag labs were normal. Her hgb went from 11.7 upon admission to 8.2 post delivery. She denies dizziness or s/s of anemia. By PPD#1 she is ready to be discharged home.  Plans for the baby being facilitated by the nursing staff. Her [facility] app[facility] will [facility] during a gyn time; msg sent to schedule this. Contraception briefly discussed.  Physical Exam:  General: alert, cooperative and no distress Heart: RRR Lungs: nl effort Lochia: appropriate Uterine Fundus: firm DVT Evaluation: No evidence of DVT seen on physical exam.  Discharge Diagnoses: Vag del of 31.4wk IUFD; Probable placental abruption; Anemia  Discharge Information: Date: 09/01/2013 Activity: pelvic rest Diet: routine Medications: PNV, Ibuprofen and Iron Condition: stable Instructions: refer to practice specific booklet Discharge to: home Follow-up Information   Follow up with [facility]. (You will be contacted with a postpartum appointment.)    Contact information:   [address] [place] [address] [phone]      Newborn Data: Josefine Class female   Birth Weight: 4 lb 3 oz (1899 g) APGAR: 0, 0   SHAW, KIMBERLY CNM 09/01/2013, 9:12 AM

## 2013-09-01 NOTE — Anesthesia Postprocedure Evaluation (Signed)
  Anesthesia Post-op Note  Patient: Monique Cardenas  Procedure(s) Performed: * No procedures listed *  Patient Location: PACU and Women's Unit  Anesthesia Type:Epidural  Level of Consciousness: awake, alert  and oriented  Airway and Oxygen Therapy: Patient Spontanous Breathing  Post-op Pain: mild  Post-op Assessment: Post-op Vital signs reviewed, Patient's Cardiovascular Status Stable, Respiratory Function Stable, No signs of Nausea or vomiting, Adequate PO intake, Pain level controlled and No headache  Post-op Vital Signs: Reviewed and stable  Last Vitals:  Filed Vitals:   09/01/13 0524  BP: 134/81  Pulse: 89  Temp: 36.8 C  Resp: 18    Complications: No apparent anesthesia complications

## 2013-09-01 NOTE — Progress Notes (Signed)
Rn notified by Elder Negus RN pt was d/c with family, stable and ambulatory to private car.

## 2013-09-01 NOTE — Progress Notes (Signed)
D/c instructions and prescriptions reviewed with pt. Pt verbalized understanding. Pt will call and schedule a f/u appt at the clinic. Pt states that her ride will be here around 5 pm and that she is waiting for [employer] to call her back with information for the cremation. Pt will RN updated. Will continue to monitor. No further questions at this time.

## 2013-09-03 NOTE — Progress Notes (Signed)
Post discharge ur review completed. 

## 2013-09-04 ENCOUNTER — Encounter: Payer: Self-pay | Admitting: Family Medicine

## 2013-09-06 NOTE — Discharge Summary (Signed)
Attestation of Attending Supervision of Advanced Practitioner (CNM/NP): Evaluation and management procedures were performed by the Advanced Practitioner under my supervision and collaboration.  I have reviewed the Advanced Practitioner's note and chart, and I agree with the management and plan.  HARRAWAY-SMITH, Javonni Macke 10:51 AM

## 2013-09-12 ENCOUNTER — Encounter: Payer: Medicaid Other | Admitting: Advanced Practice Midwife

## 2013-10-05 ENCOUNTER — Ambulatory Visit (INDEPENDENT_AMBULATORY_CARE_PROVIDER_SITE_OTHER): Payer: Medicaid Other | Admitting: Family Medicine

## 2013-10-05 NOTE — Patient Instructions (Signed)
Postpartum Depression and Baby Blues The postpartum period begins right after the birth of a baby. During this time, there is often a great amount of joy and excitement. It is also a time of many changes in the life of the parents. Regardless of how many times a mother gives birth, each child brings new challenges and dynamics to the family. It is not unusual to have feelings of excitement along with confusing shifts in moods, emotions, and thoughts. All mothers are at risk of developing postpartum depression or the "baby blues." These mood changes can occur right after giving birth, or they may occur many months after giving birth. The baby blues or postpartum depression can be mild or severe. Additionally, postpartum depression can go away rather quickly, or it can be a long-term condition.  CAUSES Raised hormone levels and the rapid drop in those levels are thought to be a main cause of postpartum depression and the baby blues. A number of hormones change during and after pregnancy. Estrogen and progesterone usually decrease right after the delivery of your baby. The levels of thyroid hormone and various cortisol steroids also rapidly drop. Other factors that play a role in these mood changes include major life events and genetics.  RISK FACTORS If you have any of the following risks for the baby blues or postpartum depression, know what symptoms to watch out for during the postpartum period. Risk factors that may increase the likelihood of getting the baby blues or postpartum depression include:  Having a personal or family history of depression.   Having depression while being pregnant.   Having premenstrual mood issues or mood issues related to oral contraceptives.  Having a lot of life stress.   Having marital conflict.   Lacking a social support network.   Having a baby with special needs.   Having health problems, such as diabetes.  SIGNS AND SYMPTOMS Symptoms of baby blues  include:  Brief changes in mood, such as going from extreme happiness to sadness.  Decreased concentration.   Difficulty sleeping.   Crying spells, tearfulness.   Irritability.   Anxiety.  Symptoms of postpartum depression typically begin within the first month after giving birth. These symptoms include:  Difficulty sleeping or excessive sleepiness.   Marked weight loss.   Agitation.   Feelings of worthlessness.   Lack of interest in activity or food.  Postpartum psychosis is a very serious condition and can be dangerous. Fortunately, it is rare. Displaying any of the following symptoms is cause for immediate medical attention. Symptoms of postpartum psychosis include:   Hallucinations and delusions.   Bizarre or disorganized behavior.   Confusion or disorientation.  DIAGNOSIS  A diagnosis is made by an evaluation of your symptoms. There are no medical or lab tests that lead to a diagnosis, but there are various questionnaires that a health care provider may use to identify those with the baby blues, postpartum depression, or psychosis. Often, a screening tool called the Edinburgh Postnatal Depression Scale is used to diagnose depression in the postpartum period.  TREATMENT The baby blues usually goes away on its own in 1-2 weeks. Social support is often all that is needed. You will be encouraged to get adequate sleep and rest. Occasionally, you may be given medicines to help you sleep.  Postpartum depression requires treatment because it can last several months or longer if it is not treated. Treatment may include individual or group therapy, medicine, or both to address any social, physiological, and psychological   factors that may play a role in the depression. Regular exercise, a healthy diet, rest, and social support may also be strongly recommended.  Postpartum psychosis is more serious and needs treatment right away. Hospitalization is often needed. HOME CARE  INSTRUCTIONS  Get as much rest as you can. Nap when the baby sleeps.   Exercise regularly. Some women find yoga and walking to be beneficial.   Eat a balanced and nourishing diet.   Do little things that you enjoy. Have a cup of tea, take a bubble bath, read your favorite magazine, or listen to your favorite music.  Avoid alcohol.   Ask for help with household chores, cooking, grocery shopping, or running errands as needed. Do not try to do everything.   Talk to people close to you about how you are feeling. Get support from your partner, family members, friends, or other new moms.  Try to stay positive in how you think. Think about the things you are grateful for.   Do not spend a lot of time alone.   Only take over-the-counter or prescription medicine as directed by your health care provider.  Keep all your postpartum appointments.   Let your health care provider know if you have any concerns.  SEEK MEDICAL CARE IF: You are having a reaction to or problems with your medicine. SEEK IMMEDIATE MEDICAL CARE IF:  You have suicidal feelings.   You think you may harm the baby or someone else. MAKE SURE YOU:  Understand these instructions.  Will watch your condition.  Will get help right away if you are not doing well or get worse. Document Released: 09/25/2003 Document Revised: 12/26/2012 Document Reviewed: 10/02/2012 ExitCare Patient Information 2015 [employer]. This information is not intended to replace advice given to you by your health care provider. Make sure you discuss any questions you have with your health care provider.  

## 2013-10-05 NOTE — Progress Notes (Signed)
  Subjective:     Monique Cardenas is a 28 y.o. female who presents for a postpartum visit. She is 6 weeks postpartum following a spontaneous vaginal delivery. I have fully reviewed the prenatal and intrapartum course. The delivery was at 31 gestational weeks due to IUFD. Outcome: spontaneous vaginal delivery. Anesthesia: epidural. Postpartum course has been normal. Bleeding no bleeding. Bowel function is normal. Bladder function is normal. Patient is not sexually active. Contraception method is none. Postpartum depression screening: negative.  The following portions of the patient's history were reviewed and updated as appropriate: allergies, current medications, past family history, past medical history, past social history, past surgical history and problem list.  Review of Systems Pertinent items are noted in HPI.   Objective:    BP 147/86  Pulse 70  Ht 5\' 4"  (1.626 m)  Wt 222 lb 9.6 oz (100.971 kg)  BMI 38.19 kg/m2  General:  alert, cooperative and no distress  Lungs: clear to auscultation bilaterally  Heart:  regular rate and rhythm, S1, S2 normal, no murmur, click, rub or gallop  Abdomen: soft, non-tender; bowel sounds normal; no masses,  no organomegaly        Assessment:     6 week postpartum exam. Pap smear not done at today's visit. IUFD  Plan:    1. Contraception: none 2. Follow up in: 1 year or as needed.  3.  Discussed waiting for at least a year prior to becoming pregnant 4.  Arranged SW to call patient regarding counseling.

## 2013-10-16 ENCOUNTER — Ambulatory Visit: Payer: Medicaid Other

## 2013-10-17 ENCOUNTER — Ambulatory Visit: Payer: Medicaid Other

## 2013-10-22 ENCOUNTER — Ambulatory Visit: Payer: Medicaid Other

## 2013-11-05 ENCOUNTER — Encounter (HOSPITAL_COMMUNITY): Payer: Self-pay | Admitting: *Deleted

## 2013-11-21 ENCOUNTER — Ambulatory Visit: Payer: Medicaid Other | Admitting: Obstetrics & Gynecology

## 2014-01-16 ENCOUNTER — Ambulatory Visit: Payer: Medicaid Other | Admitting: Obstetrics & Gynecology

## 2014-04-02 ENCOUNTER — Encounter (HOSPITAL_COMMUNITY): Payer: Self-pay | Admitting: Emergency Medicine

## 2014-04-02 ENCOUNTER — Emergency Department (HOSPITAL_COMMUNITY)
Admission: EM | Admit: 2014-04-02 | Discharge: 2014-04-02 | Disposition: A | Discharge status: Home / Self Care | Payer: Medicaid Other | Attending: Emergency Medicine | Admitting: Emergency Medicine

## 2014-04-02 DIAGNOSIS — M545 Low back pain, unspecified: Secondary | ICD-10-CM

## 2014-04-02 DIAGNOSIS — Z3202 Encounter for pregnancy test, result negative: Secondary | ICD-10-CM | POA: Insufficient documentation

## 2014-04-02 DIAGNOSIS — R112 Nausea with vomiting, unspecified: Secondary | ICD-10-CM

## 2014-04-02 DIAGNOSIS — Z79899 Other long term (current) drug therapy: Secondary | ICD-10-CM | POA: Insufficient documentation

## 2014-04-02 DIAGNOSIS — J45909 Unspecified asthma, uncomplicated: Secondary | ICD-10-CM | POA: Diagnosis not present

## 2014-04-02 LAB — COMPREHENSIVE METABOLIC PANEL
ALT: 28 U/L (ref 0–35)
AST: 55 U/L — ABNORMAL HIGH (ref 0–37)
Albumin: 3.7 g/dL (ref 3.5–5.2)
Alkaline Phosphatase: 80 U/L (ref 39–117)
Anion gap: 9 (ref 5–15)
BUN: 8 mg/dL (ref 6–23)
CO2: 25 mmol/L (ref 19–32)
Calcium: 8.8 mg/dL (ref 8.4–10.5)
Chloride: 105 mmol/L (ref 96–112)
Creatinine, Ser: 0.92 mg/dL (ref 0.50–1.10)
GFR calc Af Amer: >90 mL/min (ref >90)
GFR calc non Af Amer: 85 mL/min — ABNORMAL LOW (ref >90)
GLUCOSE: 99 mg/dL (ref 70–99)
POTASSIUM: 4.2 mmol/L (ref 3.5–5.1)
Sodium: 139 mmol/L (ref 135–145)
Total Bilirubin: 0.3 mg/dL (ref 0.3–1.2)
Total Protein: 6.9 g/dL (ref 6.0–8.3)

## 2014-04-02 LAB — URINALYSIS, ROUTINE W REFLEX MICROSCOPIC
BILIRUBIN URINE: NEGATIVE
Glucose, UA: NEGATIVE mg/dL
Hgb urine dipstick: NEGATIVE
KETONES UR: NEGATIVE mg/dL
Leukocytes, UA: NEGATIVE
Nitrite: NEGATIVE
Protein, ur: NEGATIVE mg/dL
Specific Gravity, Urine: 1.027 (ref 1.005–1.030)
Urobilinogen, UA: 1 mg/dL (ref 0.0–1.0)
pH: 5.5 (ref 5.0–8.0)

## 2014-04-02 LAB — CBC WITH DIFFERENTIAL/PLATELET
BASOS ABS: 0 10*3/uL (ref 0.0–0.1)
Basophils Relative: 0 % (ref 0–1)
Eosinophils Absolute: 0.3 10*3/uL (ref 0.0–0.7)
Eosinophils Relative: 3 % (ref 0–5)
HCT: 41.7 % (ref 36.0–46.0)
Hemoglobin: 14.1 g/dL (ref 12.0–15.0)
Lymphocytes Relative: 25 % (ref 12–46)
Lymphs Abs: 3.3 10*3/uL (ref 0.7–4.0)
MCH: 31.5 pg (ref 26.0–34.0)
MCHC: 33.8 g/dL (ref 30.0–36.0)
MCV: 93.3 fL (ref 78.0–100.0)
MONOS PCT: 5 % (ref 3–12)
Monocytes Absolute: 0.6 10*3/uL (ref 0.1–1.0)
Neutro Abs: 8.8 10*3/uL — ABNORMAL HIGH (ref 1.7–7.7)
Neutrophils Relative %: 67 % (ref 43–77)
Platelets: 225 10*3/uL (ref 150–400)
RBC: 4.47 MIL/uL (ref 3.87–5.11)
RDW: 14 % (ref 11.5–15.5)
WBC: 13 10*3/uL — AB (ref 4.0–10.5)

## 2014-04-02 LAB — POC URINE PREG, ED: Preg Test, Ur: NEGATIVE

## 2014-04-02 NOTE — Discharge Instructions (Signed)
Back Pain, Adult °Back pain is very common. The pain often gets better over time. The cause of back pain is usually not dangerous. Most people can learn to manage their back pain on their own.  °HOME CARE  °· Stay active. Start with short walks on flat ground if you can. Try to walk farther each day. °· Do not sit, drive, or stand in one place for more than 30 minutes. Do not stay in bed. °· Do not avoid exercise or work. Activity can help your back heal faster. °· Be careful when you bend or lift an object. Bend at your knees, keep the object close to you, and do not twist. °· Sleep on a firm mattress. Lie on your side, and bend your knees. If you lie on your back, put a pillow under your knees. °· Only take medicines as told by your doctor. °· Put ice on the injured area. °¨ Put ice in a plastic bag. °¨ Place a towel between your skin and the bag. °¨ Leave the ice on for 15-20 minutes, 03-04 times a day for the first 2 to 3 days. After that, you can switch between ice and heat packs. °· Ask your doctor about back exercises or massage. °· Avoid feeling anxious or stressed. Find good ways to deal with stress, such as exercise. °GET HELP RIGHT AWAY IF:  °· Your pain does not go away with rest or medicine. °· Your pain does not go away in 1 week. °· You have new problems. °· You do not feel well. °· The pain spreads into your legs. °· You cannot control when you poop (bowel movement) or pee (urinate). °· Your arms or legs feel weak or lose feeling (numbness). °· You feel sick to your stomach (nauseous) or throw up (vomit). °· You have belly (abdominal) pain. °· You feel like you may pass out (faint). °MAKE SURE YOU:  °· Understand these instructions. °· Will watch your condition. °· Will get help right away if you are not doing well or get worse. °Document Released: 06/09/2007 Document Revised: 03/15/2011 Document Reviewed: 04/24/2013 °ExitCare® Patient Information ©2015 [employer]. This information is not intended  to replace advice given to you by your health care provider. Make sure you discuss any questions you have with your health care provider. ° °

## 2014-04-02 NOTE — ED Notes (Signed)
Pt sts N/V starting today and lower back pain

## 2014-04-02 NOTE — ED Notes (Signed)
Pt st's she had pain in lower back earlier today and had nausea with vomiting.  Pt st's pain has subsided and does not feel nauseated at this time

## 2014-04-02 NOTE — ED Provider Notes (Signed)
CSN: [id number]     Arrival date & time 04/02/14  1854 History   First MD Initiated Contact with Patient 04/02/14 2153     Chief Complaint  Patient presents with  . Emesis  . Back Pain     (Consider location/radiation/quality/duration/timing/severity/associated sxs/prior Treatment) Patient is a 29 y.o. female presenting with back pain. The history is provided by the patient.  Back Pain Location:  Lumbar spine Quality:  Aching Radiates to:  Does not radiate Pain severity:  Severe Onset quality:  Sudden Duration:  2 hours Timing:  Constant Progression:  Resolved Chronicity:  New Relieved by:  None tried Worsened by:  Nothing tried Ineffective treatments:  None tried Associated symptoms: no abdominal pain, no chest pain, no dysuria, no fever and no weakness     Past Medical History  Diagnosis Date  . Asthma    Past Surgical History  Procedure Laterality Date  . No past surgeries     Family History  Problem Relation Age of Onset  . Other Neg Hx    History  Substance Use Topics  . Smoking status: Never Smoker   . Smokeless tobacco: Never Used  . Alcohol Use: No   OB History    Gravida Para Term Preterm AB TAB SAB Ectopic Multiple Living       Review of Systems  Constitutional: Negative for fever.  Cardiovascular: Negative for chest pain.  Gastrointestinal: Negative for abdominal pain.  Genitourinary: Negative for dysuria.  Musculoskeletal: Positive for back pain.  Neurological: Negative for weakness.  All other systems reviewed and are negative.     Allergies  Review of patient's allergies indicates no known allergies.  Home Medications   Prior to Admission medications   Medication Sig Start Date End Date Taking? Authorizing Provider  albuterol (PROVENTIL HFA;VENTOLIN HFA) 108 (90 BASE) MCG/ACT inhaler Inhale 2 puffs into the lungs every 6 (six) hours as needed. For cough or shortness of breath   Yes Historical Provider, MD   ibuprofen (ADVIL,MOTRIN) 600 MG tablet Take 1 tablet (600 mg total) by mouth every 6 (six) hours as needed (pain scale < 4). 09/01/13  Yes [name], CNM  ferrous sulfate (FERROUSUL) 325 (65 FE) MG tablet Take 1 tablet (325 mg total) by mouth 2 (two) times daily with a meal. Patient not taking: Reported on 04/02/2014 09/01/13   [name], CNM  prenatal vitamin w/FE, FA (PRENATAL 1 + 1) 27-1 MG TABS tablet Take 1 tablet by mouth daily at 12 noon. Patient not taking: Reported on 04/02/2014 08/29/13   Deirdre C Poe, CNM   BP 130/69 mmHg  Pulse 80  Temp(Src) 97.8 F (36.6 C) (Oral)  Resp 20  Ht  (1.626 m)  SpO2 100% Physical Exam  Constitutional: She appears well-developed and well-nourished. No distress.  Pleasant, cooperative, in no distress  HENT:  Head: Normocephalic and atraumatic.  Mouth/Throat: Oropharynx is clear and moist.  Eyes: EOM are normal. Pupils are equal, round, and reactive to light.  Neck: Normal range of motion. Neck supple.  Cardiovascular: Normal rate, regular rhythm, normal heart sounds and intact distal pulses.  Exam reveals no gallop and no friction rub.   No murmur heard. Pulmonary/Chest: Effort normal and breath sounds normal. No respiratory distress. She has no wheezes. She has no rales.  Abdominal: Soft. She exhibits no distension and no mass. There is no tenderness. There is no rebound and no guarding.  Musculoskeletal:  Normal range of motion. She exhibits no edema or tenderness.  No thoracic or lumbar back tenderness. No paraspinal tenderness.  Skin: Skin is warm and dry. No rash noted. She is not diaphoretic.  Psychiatric: She has a normal mood and affect. Her behavior is normal. Judgment and thought content normal.  Nursing note and vitals reviewed.   ED Course  Procedures (including critical care time) Labs Review Labs Reviewed  CBC WITH DIFFERENTIAL/PLATELET - Abnormal; Notable for the following:    WBC 13.0 (*)    Neutro Abs 8.8 (*)     All other components within normal limits  COMPREHENSIVE METABOLIC PANEL - Abnormal; Notable for the following:    AST 55 (*)    GFR calc non Af Amer 85 (*)    All other components within normal limits  URINALYSIS, ROUTINE W REFLEX MICROSCOPIC - Abnormal; Notable for the following:    APPearance CLOUDY (*)    All other components within normal limits  POC URINE PREG, ED    Imaging Review No results found.   EKG Interpretation None      MDM   Final diagnoses:  Non-intractable vomiting with nausea, vomiting of unspecified type  Right-sided low back pain without sciatica   29 year old female presents with right lower back pain as well as nausea and vomiting. States she has had intermittent lower back pain since her pregnancy a few months back. That pain was described in the right lumbar region and nausea and vomiting that was nonbloody and nonbilious. No diarrhea. No urinary symptoms and no hematuria or vaginal discharge or vaginal bleeding. No abdominal pain. Denies fevers.  Asymptomatic on arrival. Symptoms resolved in the lobby. Vital signs are stable. Laboratory workup is reassuring and urinalysis is negative and point care pregnancy is negative. She has no pain on exam and abdominal exam is benign. No further nausea and no emesis. Given symptoms resolution, no further workup at this time. Unclear etiology, but would prefer to PCP for further evaluation of intermittent back pain. Possible etiology includes passed kidney stone or other benign etiology, although since she is asymptomatic now, imaging or further workup would not be beneficial. Also, return to care if symptoms persist or return.  Dorna LeitzAlex Wynter Grave, MD 04/02/14 [id number]  Blake DivineJohn Wofford, MD 04/04/14 [phone]

## 2015-04-16 IMAGING — US US OB COMP +14 WK
2 series · 12 of 28 positions shown · non-contrast
Comparison: none

[Series 1: us ob comp +14 wk · 11 of 70 slices shown (1 of 2)]
[im 3/70]
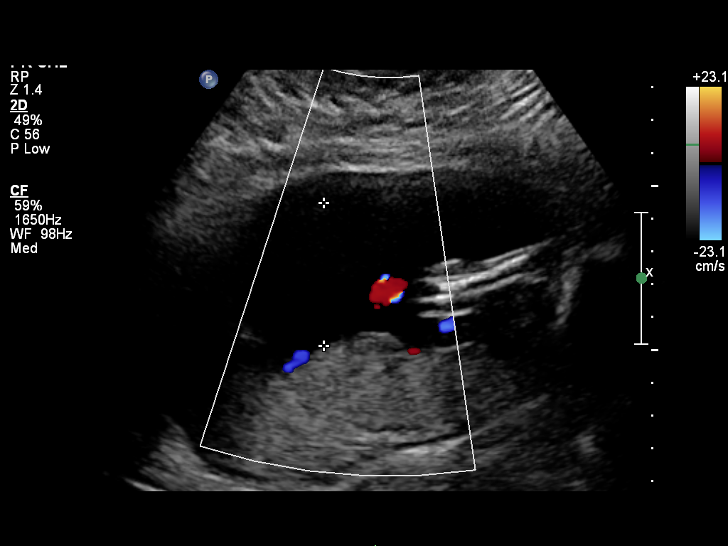
[im 9/70]
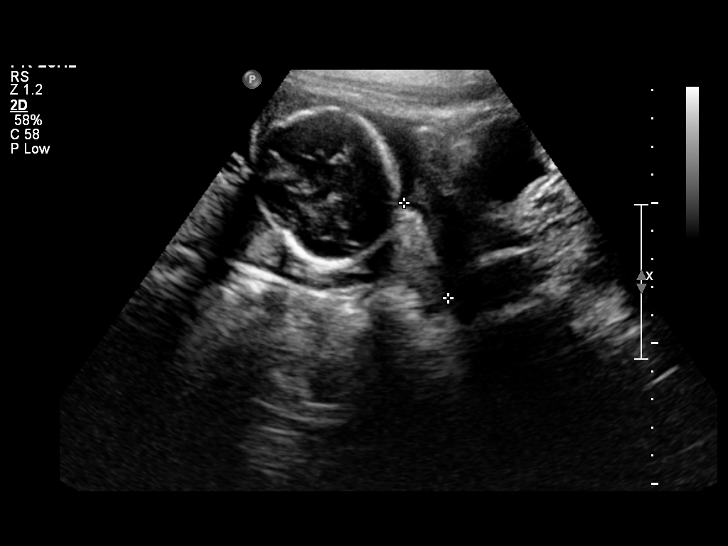
[im 14/70]
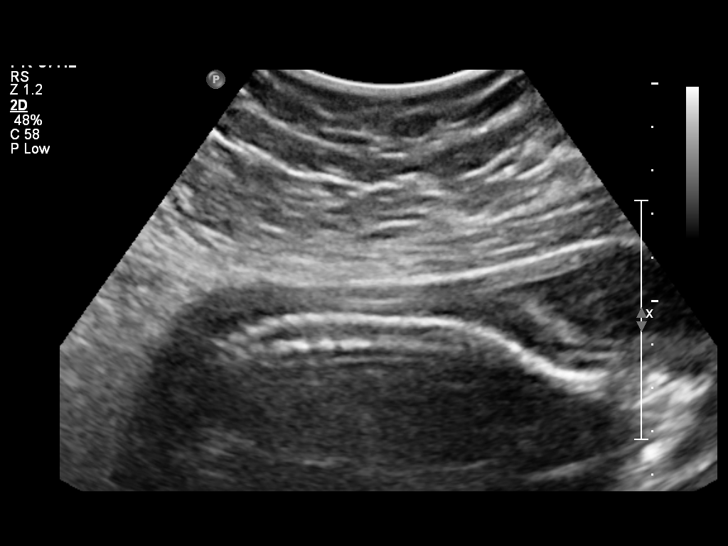
[im 23/70]
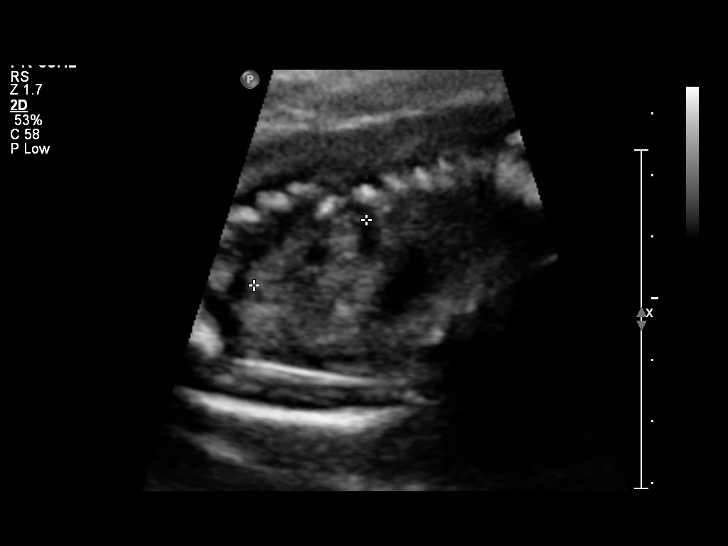
[im 28/70]
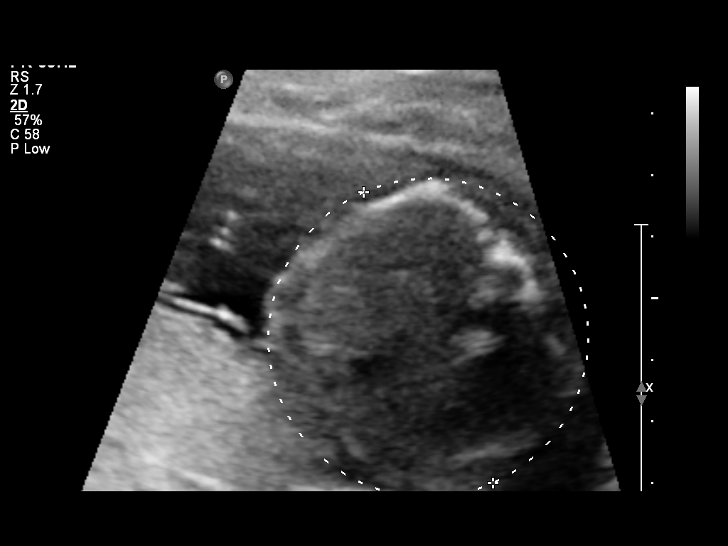
[im 34/70]
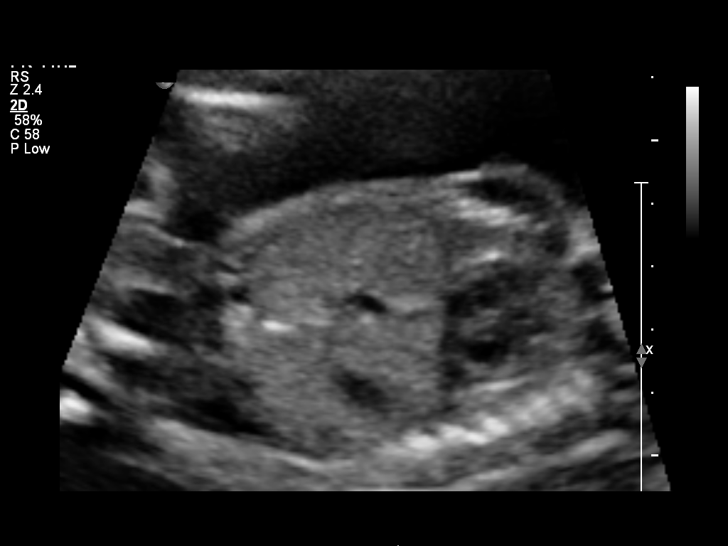
[im 42/70]
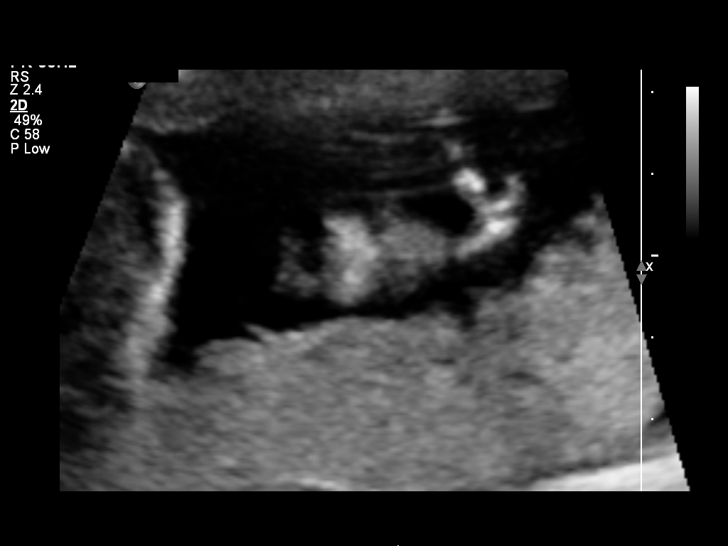
[im 47/70]
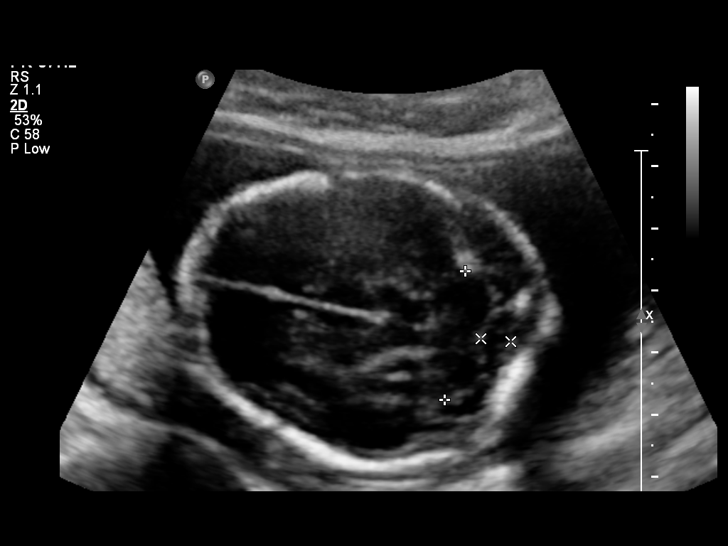
[im 53/70]
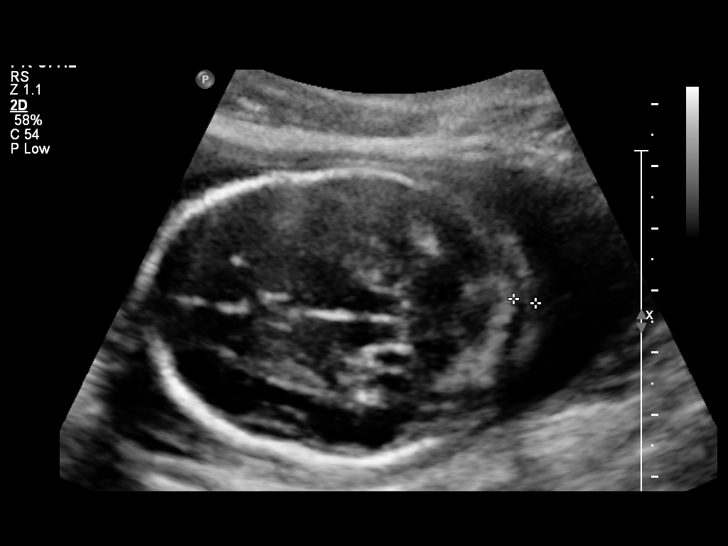
[im 61/70]
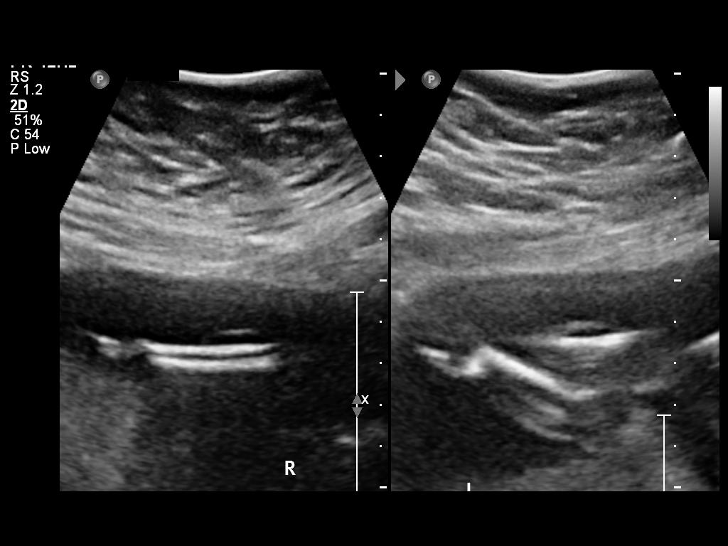
[im 67/70]
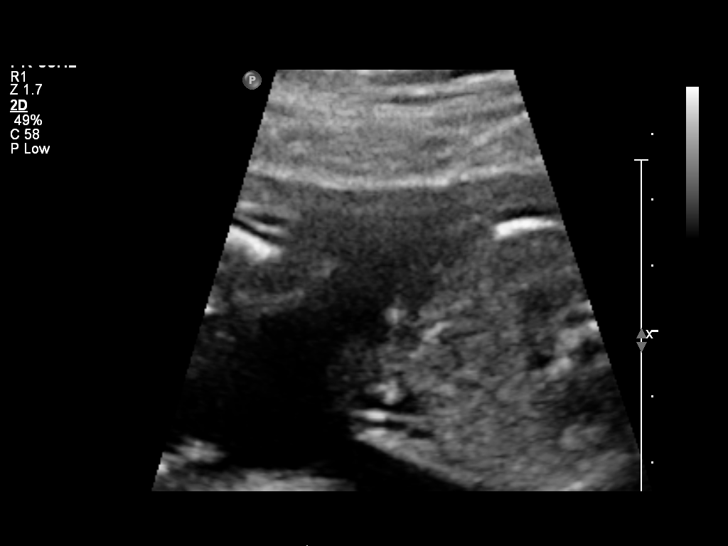

[Series 1: us ob comp +14 wk · 1 of 5 slices shown (2 of 2)]
[im 1/5]
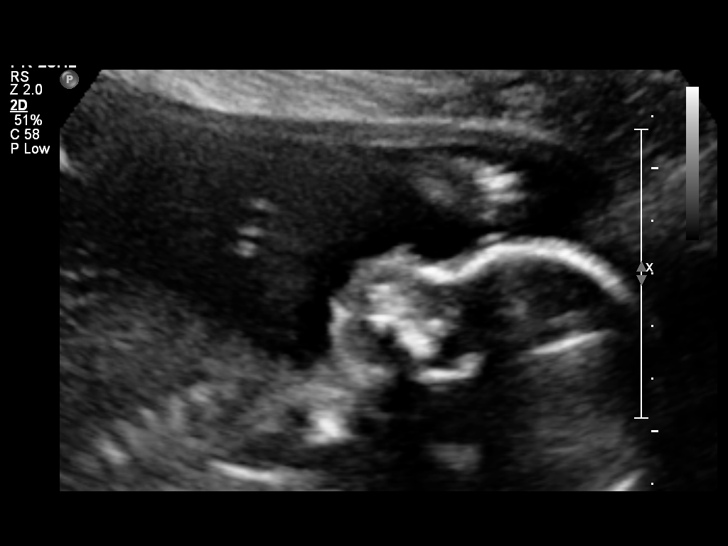

[12 of 28 positions shown; findings below may reference images not displayed]

OBSTETRICS REPORT
                      (Signed Final 06/05/2013 [DATE])

Service(s) Provided

 US OB COMP + 14 WK                                    76805.1
Indications

 Basic anatomic survey
Fetal Evaluation

 Num Of Fetuses:    1
 Fetal Heart Rate:  149                          bpm
 Cardiac Activity:  Observed
 Presentation:      Cephalic
 Placenta:          Posterior, above cervical
                    os
 P. Cord            Visualized, central
 Insertion:

 Amniotic Fluid
 AFI FV:      Subjectively within normal limits
                                             Larg Pckt:     4.3  cm
Biometry

 BPD:     46.5  mm     G. Age:  20w 0d                CI:         70.4   70 - 86
                                                      FL/HC:      19.6   15.9 -

 HC:     176.7  mm     G. Age:  20w 1d       24  %    HC/AC:      1.10   1.06 -

 AC:     160.8  mm     G. Age:  21w 1d       64  %    FL/BPD:
 FL:      34.6  mm     G. Age:  20w 6d       53  %    FL/AC:      21.5   20 - 24
 HUM:     31.6  mm     G. Age:  20w 4d       49  %
 CER:       21  mm     G. Age:  20w 0d       37  %
 NFT:     3.61  mm

 Est. FW:     387  gm    0 lb 14 oz      52  %
Gestational Age

 LMP:           19w 1d        Date:  01/22/13                 EDD:   10/29/13
 U/S Today:     20w 4d                                        EDD:   10/19/13
 Best:          20w 4d     Det. By:  U/S (06/05/13)           EDD:   10/19/13
Anatomy

 Cranium:          Appears normal         Aortic Arch:      Appears normal
 Fetal Cavum:      Appears normal         Ductal Arch:      Appears normal
 Ventricles:       Appears normal         Diaphragm:        Appears normal
 Choroid Plexus:   Appears normal         Stomach:          Appears normal, left
                                                            sided
 Cerebellum:       Appears normal         Abdomen:          Appears normal
 Posterior Fossa:  Appears normal         Abdominal Wall:   Appears nml (cord
                                                            insert, abd wall)
 Nuchal Fold:      Appears normal         Cord Vessels:     Appears normal (3
                                                            vessel cord)
 Face:             Appears normal         Kidneys:          Appear normal
                   (orbits and profile)
 Lips:             Appears normal         Bladder:          Appears normal
 Heart:            Not well visualized    Spine:            Appears normal
 RVOT:             Not well visualized    Lower             Appears normal
                                          Extremities:
 LVOT:             Not well visualized    Upper             Appears normal
                                          Extremities:

 Other:  Fetus appears to be a male. Technically difficult due to fetal position.
         Heels visualized.
Targeted Anatomy

 Fetal Central Nervous System
 Lat. Ventricles:  5.9                    Cisterna Magna:
Cervix Uterus Adnexa

 Cervical Length:    3.72     cm

 Cervix:       Normal appearance by transabdominal scan.
Impression

 SIUP at 20+4 weeks
 Normal detailed fetal anatomy; limited views of the heart
 Markers of aneuploidy: none
 Normal amniotic fluid volume
 EDC based on today's measurements
Recommendations

 Follow-up ultrasound in 4-6 weeks to complete anatomy
 survey

 questions or concerns.

## 2015-07-11 IMAGING — US US OB LIMITED
1 series · 3 of 3 positions shown · non-contrast
Comparison: none

[Series 1: us ob limited · 3 of 3 slices shown]
[im 1/3]
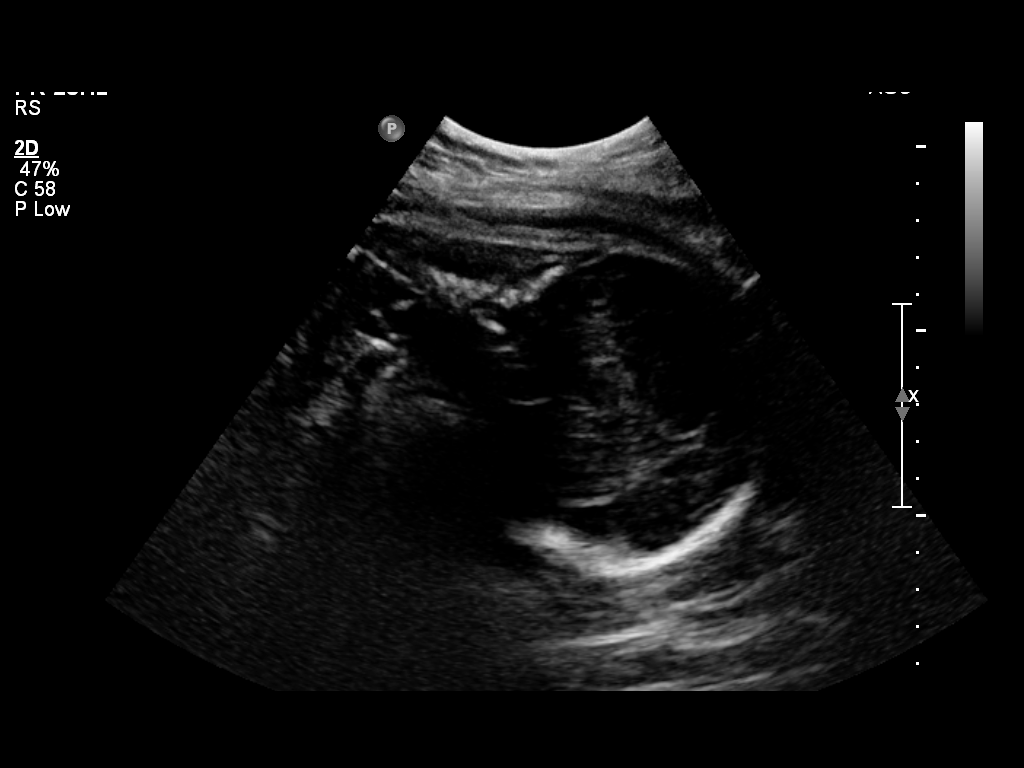
[im 2/3]
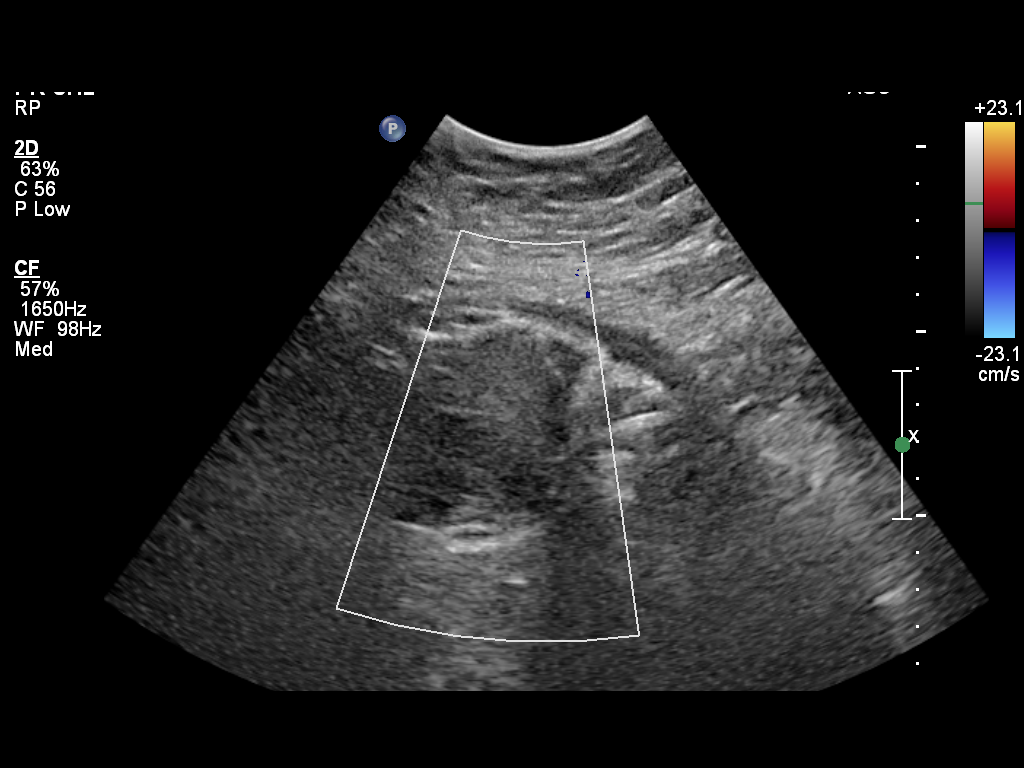
[im 3/3]
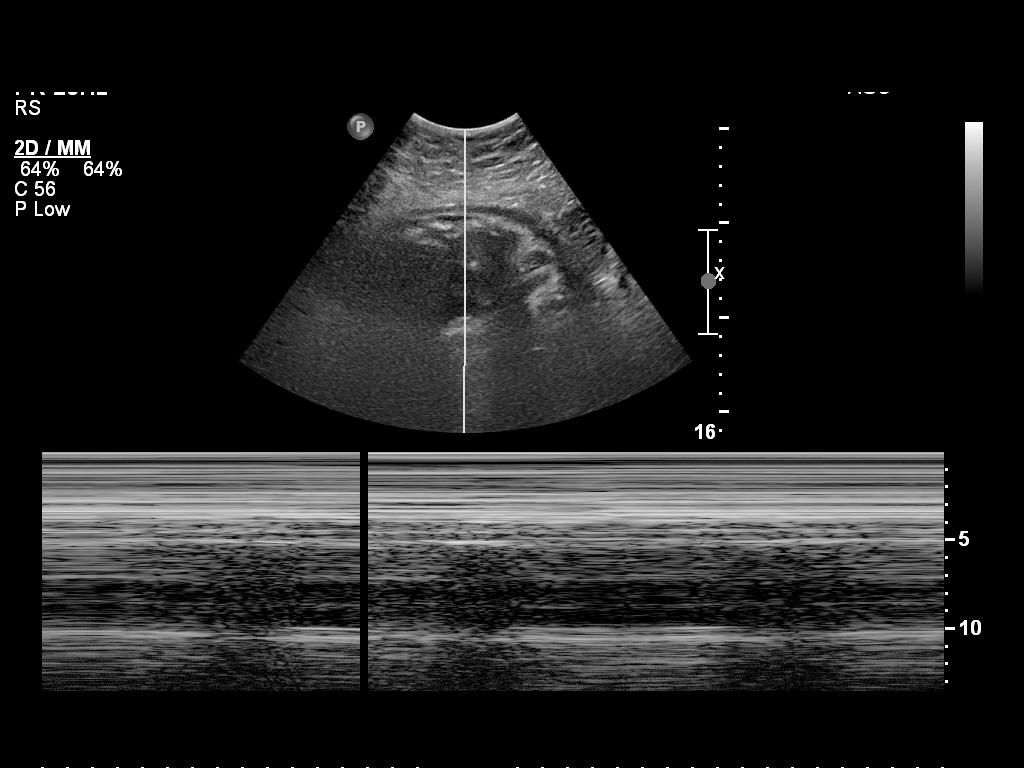

[3 of 3 positions shown; findings below may reference images not displayed]

OBSTETRICS REPORT
                      (Signed Final 08/31/2013 [DATE])

Service(s) Provided

 [HOSPITAL]                                         76815.0
Indications

 Pregnancy with inconclusive fetal viability (No FHT)
 Obesity
 Vaginal bleeding, unknown etiology
Fetal Evaluation

 Num Of Fetuses:    1
 Cardiac Activity:  Absent
 Presentation:      Cephalic
Gestational Age

 LMP:           31w 3d        Date:  01/22/13                 EDD:   10/29/13
 Best:          32w 6d     Det. By:  U/S    (06/05/13)        EDD:   10/19/13
Impression

 SIUP at 32+6 weeks
 Fetal demise

 Dr. Elwaer aware

Recommendations

 Follow-up as clinically indicated

 questions or concerns.

## 2015-11-01 ENCOUNTER — Ambulatory Visit (HOSPITAL_COMMUNITY)
Admission: EM | Admit: 2015-11-01 | Discharge: 2015-11-01 | Disposition: A | Discharge status: Home / Self Care | Payer: Medicaid Other | Attending: Internal Medicine | Admitting: Internal Medicine

## 2015-11-01 ENCOUNTER — Encounter (HOSPITAL_COMMUNITY): Payer: Self-pay | Admitting: Emergency Medicine

## 2015-11-01 DIAGNOSIS — N898 Other specified noninflammatory disorders of vagina: Secondary | ICD-10-CM | POA: Insufficient documentation

## 2015-11-01 DIAGNOSIS — R300 Dysuria: Secondary | ICD-10-CM | POA: Insufficient documentation

## 2015-11-01 DIAGNOSIS — Z79899 Other long term (current) drug therapy: Secondary | ICD-10-CM | POA: Insufficient documentation

## 2015-11-01 DIAGNOSIS — Z202 Contact with and (suspected) exposure to infections with a predominantly sexual mode of transmission: Secondary | ICD-10-CM | POA: Insufficient documentation

## 2015-11-01 DIAGNOSIS — J45909 Unspecified asthma, uncomplicated: Secondary | ICD-10-CM | POA: Insufficient documentation

## 2015-11-01 MED ORDER — CEFTRIAXONE SODIUM 250 MG IJ SOLR
250.0000 mg | Freq: Once | INTRAMUSCULAR | Status: AC
  Administered 2015-11-01: 250 mg via INTRAMUSCULAR

## 2015-11-01 MED ORDER — AZITHROMYCIN 250 MG PO TABS
1000.0000 mg | ORAL_TABLET | Freq: Once | ORAL | Status: AC
  Administered 2015-11-01: 1000 mg via ORAL

## 2015-11-01 MED ORDER — LIDOCAINE HCL (PF) 1 % IJ SOLN
INTRAMUSCULAR | Status: AC
  Filled 2015-11-01: qty 2

## 2015-11-01 MED ORDER — AZITHROMYCIN 250 MG PO TABS
ORAL_TABLET | ORAL | Status: AC
  Filled 2015-11-01: qty 4

## 2015-11-01 MED ORDER — CEFTRIAXONE SODIUM 250 MG IJ SOLR
INTRAMUSCULAR | Status: AC
  Filled 2015-11-01: qty 250

## 2015-11-01 NOTE — ED Triage Notes (Signed)
[facility] a [facility] discharge, odor and slight dysuria x 1 week.

## 2015-11-01 NOTE — ED Provider Notes (Signed)
CSN: [id number]     Arrival date & time 11/01/15  1215 History   First MD Initiated Contact with Patient 11/01/15 1353     Chief Complaint  Patient presents with  . Vaginal Discharge  . Dysuria   (Consider location/radiation/quality/duration/timing/severity/associated sxs/prior Treatment) HPI NP 30 Y/O FEMALE Pt is advised BY  Partner  confirmation of STD testing and has suggested patient be treated. Pt denies any symptoms at this time.  Denies pain, or other symptoms at this time   Denies: fever chills, previous STD    Past Medical History:  Diagnosis Date  . Asthma    Past Surgical History:  Procedure Laterality Date  . NO PAST SURGERIES     Family History  Problem Relation Age of Onset  . Other Neg Hx    Social History  Substance Use Topics  . Smoking status: Never Smoker  . Smokeless tobacco: Never Used  . Alcohol use No   OB History    Gravida Para Term Preterm AB Living   1 1 0 1 0 0   SAB TAB Ectopic Multiple Live Births   0 0 0 0       Review of Systems  Denies: HEADACHE, NAUSEA, ABDOMINAL PAIN, CHEST PAIN, CONGESTION, DYSURIA, SHORTNESS OF BREATH  Allergies  Review of patient's allergies indicates no known allergies.  Home Medications   Prior to Admission medications   Medication Sig Start Date End Date Taking? Authorizing Provider  albuterol (PROVENTIL HFA;VENTOLIN HFA) 108 (90 BASE) MCG/ACT inhaler Inhale 2 puffs into the lungs every 6 (six) hours as needed. For cough or shortness of breath    Historical Provider, MD  ferrous sulfate (FERROUSUL) 325 (65 FE) MG tablet Take 1 tablet (325 mg total) by mouth 2 (two) times daily with a meal. Patient not taking: Reported on 04/02/2014 09/01/13   Arabella MerlesKimberly D Shaw, CNM  ibuprofen (ADVIL,MOTRIN) 600 MG tablet Take 1 tablet (600 mg total) by mouth every 6 (six) hours as needed (pain scale < 4). 09/01/13   Arabella MerlesKimberly D Shaw, CNM  prenatal vitamin w/FE, FA (PRENATAL 1 + 1) 27-1 MG TABS tablet Take 1 tablet by mouth  daily at 12 noon. Patient not taking: Reported on 04/02/2014 08/29/13   Deirdre Colin Mulders Poe, CNM   Meds Ordered and Administered this Visit   Medications  cefTRIAXone (ROCEPHIN) injection 250 mg (250 mg Intramuscular Given 11/01/15 1403)  azithromycin (ZITHROMAX) tablet 1,000 mg (1,000 mg Oral Given 11/01/15 1403)    BP 121/63 (BP Location: Left Arm)   Pulse 72   Temp 98.5 F (36.9 C) (Oral)   Resp 12   LMP 10/09/2015 (Exact Date)   SpO2 100%  No data found.   Physical Exam NURSES NOTES AND VITAL SIGNS REVIEWED. CONSTITUTIONAL: Well developed, well nourished, no acute distress HEENT: normocephalic, atraumatic EYES: Conjunctiva normal NECK:normal ROM, supple, no adenopathy PULMONARY:No respiratory distress, normal effort ABDOMINAL: Soft, ND, NT BS+, No CVAT MUSCULOSKELETAL: Normal ROM of all extremities,  SKIN: warm and dry without rash PSYCHIATRIC: Mood and affect, behavior are normal  Urgent Care Course   Clinical Course    Procedures (including critical care time)  Labs Review Labs Reviewed  URINE CYTOLOGY ANCILLARY ONLY    Imaging Review No results found.   Visual Acuity Review  Right Eye Distance:   Left Eye Distance:   Bilateral Distance:    Right Eye Near:   Left Eye Near:    Bilateral Near:         MDM  1. Exposure to STD     Patient is reassured that there are no issues that require transfer to higher level of care at this time or additional tests. Patient is advised to continue home symptomatic treatment. Patient is advised that if there are new or worsening symptoms to attend the emergency department, contact primary care provider, or return to UC. Instructions of care provided discharged home in stable condition.    THIS NOTE WAS GENERATED USING A VOICE RECOGNITION SOFTWARE PROGRAM. ALL REASONABLE EFFORTS  WERE MADE TO PROOFREAD THIS DOCUMENT FOR ACCURACY.  I have verbally reviewed the discharge instructions with the patient. A printed  AVS was given to the patient.  All questions were answered prior to discharge.      Tharon AquasFrank C Caroleen Stoermer, PA 11/01/15 1429

## 2015-11-03 LAB — URINE CYTOLOGY ANCILLARY ONLY
CHLAMYDIA, DNA PROBE: NEGATIVE
NEISSERIA GONORRHEA: NEGATIVE

## 2015-11-05 ENCOUNTER — Telehealth (HOSPITAL_COMMUNITY): Payer: Self-pay | Admitting: Emergency Medicine

## 2015-11-05 LAB — URINE CYTOLOGY ANCILLARY ONLY

## 2015-11-05 MED ORDER — METRONIDAZOLE 500 MG PO TABS: 500.0000 mg | ORAL_TABLET | Freq: Two times a day (BID) | ORAL | 0 refills | Status: DC

## 2015-11-05 NOTE — Telephone Encounter (Signed)
-----   Message from Charm RingsErin J Honig, MD sent at 11/05/2015 10:13 AM EDT ----- Please notify patient on negative STI testing.  Wet prep did come back positive for BV.  If she has developed vaginal discharge, call in Flagyl 500mg  BID x7 days to her pharmacy of choice.  If she is not having any symptoms, this does not need to be treated. EH

## 2015-11-05 NOTE — Telephone Encounter (Signed)
Called pt and notified of recent lab results from visit Pt ID'd properly... Reports feeling better and sx have subsided but would still like for us to send in a Rx for Flagyl to [facility]) Adv pt if sx are not getting better to return or to f/u w/PCP Education on safe sex given Pt verb understanding.

## 2016-05-25 ENCOUNTER — Encounter (HOSPITAL_COMMUNITY): Payer: Self-pay

## 2016-05-25 ENCOUNTER — Emergency Department (HOSPITAL_COMMUNITY)
Admission: EM | Admit: 2016-05-25 | Discharge: 2016-05-25 | Disposition: A | Discharge status: Home / Self Care | Payer: Medicaid Other | Attending: Emergency Medicine | Admitting: Emergency Medicine

## 2016-05-25 DIAGNOSIS — J45909 Unspecified asthma, uncomplicated: Secondary | ICD-10-CM | POA: Insufficient documentation

## 2016-05-25 DIAGNOSIS — Z79899 Other long term (current) drug therapy: Secondary | ICD-10-CM | POA: Insufficient documentation

## 2016-05-25 DIAGNOSIS — K0889 Other specified disorders of teeth and supporting structures: Secondary | ICD-10-CM | POA: Insufficient documentation

## 2016-05-25 MED ORDER — NAPROXEN 500 MG PO TABS: 500.0000 mg | ORAL_TABLET | Freq: Two times a day (BID) | ORAL | 0 refills | Status: DC

## 2016-05-25 MED ORDER — AMOXICILLIN 500 MG PO CAPS: 500.0000 mg | ORAL_CAPSULE | Freq: Three times a day (TID) | ORAL | 0 refills | Status: DC

## 2016-05-25 NOTE — ED Triage Notes (Signed)
Pt reports dental pain to multiple teeth onset 3 days ago

## 2016-05-25 NOTE — ED Provider Notes (Signed)
[facility] Provider Note   CSN: [id number] Arrival date & time: 05/25/16  1644  By signing my name below, I, Phillips ClimesFabiola de Louis, attest that this documentation has been prepared under the direction and in the presence of Felicie Mornavid Avenell Sellers, NP.  Electronically Signed: Phillips ClimesFabiola de Louis, Scribe. 05/25/2016. 6:06 PM.  History   Chief Complaint Chief Complaint  Patient presents with  . Dental Pain   HPI Comments Monique Cardenas is a 31 y.o. female with PMHx significant for cavities, who presents to the Emergency Department with complaints of constant left sided dental pain x3 days. Pain is worse with eating. No trouble with swallowing, mouth sores or facial edema. No oropharyngeal involvement. Pt does not have a dentist. She denies experiencing any other acute sx, including fever, chills, sore throat, abdominal pain, nausea, vomiting, diarrhea or headache. Normal appetite and PO intake.   The history is provided by the patient and medical records. No language interpreter was used.   Past Medical History:  Diagnosis Date  . Asthma     Patient Active Problem List   Diagnosis Date Noted  . IUFD (intrauterine fetal death) 08/30/2013  . Obese 05/15/2013  . Supervision of normal first pregnancy in second trimester 05/15/2013    Past Surgical History:  Procedure Laterality Date  . NO PAST SURGERIES      OB History    Gravida Para Term Preterm AB Living   1 1 0 1 0 0   SAB TAB Ectopic Multiple Live Births   0 0 0 0         Home Medications    Prior to Admission medications   Medication Sig Start Date End Date Taking? Authorizing Provider  albuterol (PROVENTIL HFA;VENTOLIN HFA) 108 (90 BASE) MCG/ACT inhaler Inhale 2 puffs into the lungs every 6 (six) hours as needed. For cough or shortness of breath    [provider]  ferrous sulfate (FERROUSUL) 325 (65 FE) MG tablet Take 1 tablet (325 mg total) by mouth 2 (two) times daily with a meal. Patient not taking: Reported on  04/02/2014 09/01/13   Arabella MerlesShaw, Kimberly D, CNM  ibuprofen (ADVIL,MOTRIN) 600 MG tablet Take 1 tablet (600 mg total) by mouth every 6 (six) hours as needed (pain scale < 4). 09/01/13   Arabella MerlesShaw, Kimberly D, CNM  metroNIDAZOLE (FLAGYL) 500 MG tablet Take 1 tablet (500 mg total) by mouth 2 (two) times daily. 11/05/15   Eustace MooreMurray, Laura W, MD  prenatal vitamin w/FE, FA (PRENATAL 1 + 1) 27-1 MG TABS tablet Take 1 tablet by mouth daily at 12 noon. Patient not taking: Reported on 04/02/2014 08/29/13   Danae OrleansPoe, Deirdre C, CNM    Family History Family History  Problem Relation Age of Onset  . Other Neg Hx     Social History Social History  Substance Use Topics  . Smoking status: Never Smoker  . Smokeless tobacco: Never Used  . Alcohol use No     Allergies   Patient has no known allergies.   Review of Systems Review of Systems  Constitutional: Negative for appetite change, chills and fever.  HENT: Positive for dental problem. Negative for facial swelling, mouth sores, sore throat and trouble swallowing.   Gastrointestinal: Negative for abdominal pain, diarrhea, nausea and vomiting.  All other systems reviewed and are negative.    Physical Exam Updated Vital Signs BP 115/81 (BP Location: Left Arm)   Pulse 88   Temp 98.4 F (36.9 C) (Oral)   Resp 17   LMP  04/28/2016   SpO2 99%   Physical Exam  Constitutional: She is oriented to person, place, and time. She appears well-developed and well-nourished. No distress.  HENT:  Head: Normocephalic and atraumatic.  Mouth/Throat:    Cavity on left upper tooth. No drainable abscess. No purulent drainage.  Eyes: Conjunctivae are normal.  Neck: Neck supple.  Cardiovascular: Normal rate.   Pulmonary/Chest: Effort normal.  Abdominal: Soft. She exhibits no distension.  Musculoskeletal: She exhibits no edema.  Neurological: She is alert and oriented to person, place, and time.  Skin: Skin is warm and dry.  Psychiatric: She has a normal mood and affect.    Nursing note and vitals reviewed.    ED Treatments / Results  DIAGNOSTIC STUDIES: Oxygen Saturation is 99% on RA, nl by my interpretation.    COORDINATION OF CARE: 6:17 PM Discussed treatment plan with pt at bedside and pt agreed to plan. Gave pt outpatient dental resources. She agrees to call and make an appointment tomorrow.   Labs (all labs ordered are listed, but only abnormal results are displayed) Labs Reviewed - No data to display  EKG  EKG Interpretation None       Radiology No results found.  Procedures Procedures (including critical care time)  Medications Ordered in ED Medications - No data to display   Initial Impression / Assessment and Plan / ED Course  I have reviewed the triage vital signs and the nursing notes.  Pertinent labs & imaging results that were available during my care of the patient were reviewed by me and considered in my medical decision making (see chart for details)  Patient with toothache.  No gross abscess.  Exam unconcerning for Ludwig's angina or spread of infection. Will treat with antibiotics and pain medicine.  Urged patient to follow-up with dentist.       Final Clinical Impressions(s) / ED Diagnoses   Final diagnoses:  Dentalgia    New Prescriptions New Prescriptions   AMOXICILLIN (AMOXIL) 500 MG CAPSULE    Take 1 capsule (500 mg total) by mouth 3 (three) times daily.   NAPROXEN (NAPROSYN) 500 MG TABLET    Take 1 tablet (500 mg total) by mouth 2 (two) times daily.   I personally performed the services described in this documentation, which was scribed in my presence. The recorded information has been reviewed and is accurate.     [name], NP 05/25/16 [facility]in[facility]    Jacalyn Lefevre, MD 05/25/16 [phone]

## 2016-06-05 ENCOUNTER — Emergency Department (HOSPITAL_COMMUNITY)
Admission: EM | Admit: 2016-06-05 | Discharge: 2016-06-05 | Disposition: A | Discharge status: Home / Self Care | Payer: No Typology Code available for payment source | Attending: Emergency Medicine | Admitting: Emergency Medicine

## 2016-06-05 ENCOUNTER — Encounter (HOSPITAL_COMMUNITY): Payer: Self-pay | Admitting: Emergency Medicine

## 2016-06-05 DIAGNOSIS — Y939 Activity, unspecified: Secondary | ICD-10-CM | POA: Insufficient documentation

## 2016-06-05 DIAGNOSIS — R51 Headache: Secondary | ICD-10-CM | POA: Insufficient documentation

## 2016-06-05 DIAGNOSIS — J45909 Unspecified asthma, uncomplicated: Secondary | ICD-10-CM | POA: Insufficient documentation

## 2016-06-05 DIAGNOSIS — Y92410 Unspecified street and highway as the place of occurrence of the external cause: Secondary | ICD-10-CM | POA: Insufficient documentation

## 2016-06-05 DIAGNOSIS — R519 Headache, unspecified: Secondary | ICD-10-CM

## 2016-06-05 DIAGNOSIS — Z79899 Other long term (current) drug therapy: Secondary | ICD-10-CM | POA: Insufficient documentation

## 2016-06-05 DIAGNOSIS — S0990XA Unspecified injury of head, initial encounter: Secondary | ICD-10-CM | POA: Diagnosis present

## 2016-06-05 DIAGNOSIS — Y999 Unspecified external cause status: Secondary | ICD-10-CM | POA: Diagnosis not present

## 2016-06-05 LAB — POC URINE PREG, ED: PREG TEST UR: NEGATIVE

## 2016-06-05 MED ORDER — KETOROLAC TROMETHAMINE 60 MG/2ML IM SOLN: 60.0000 mg | Freq: Once | INTRAMUSCULAR | Status: DC

## 2016-06-05 MED ORDER — KETOROLAC TROMETHAMINE 60 MG/2ML IM SOLN
30.0000 mg | Freq: Once | INTRAMUSCULAR | Status: AC
  Administered 2016-06-05: 30 mg via INTRAMUSCULAR
  Filled 2016-06-05: qty 2

## 2016-06-05 MED ORDER — IBUPROFEN 600 MG PO TABS: 600.0000 mg | ORAL_TABLET | Freq: Four times a day (QID) | ORAL | 0 refills | Status: DC | PRN
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
## 2016-06-05 NOTE — ED Notes (Addendum)
Pt denies vision changes, N/V.
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
## 2016-06-05 NOTE — Discharge Instructions (Addendum)
You can take Tylenol or Ibuprofen as directed for pain. Do not take any ibuprofen tonight because of the medication you were given today.   Follow-up with primary care doctor in the next 24-48 hours for further evaluation. If he do not have a primary care doctor you can use 1 listed below to arrange for follow-up.  Return to the emergency department for any worsening headache, vision changes, numbness/weakness of her arms or legs, urinary or bowel accidents, difficulty walking or any other worsening or concerning symptoms.  If you do not have a primary care doctor you see regularly, please you the list below. Please call them to arrange for follow-up.    No Primary Care Doctor Call [facility]  [phone] Other agencies that provide inexpensive medical care    [facility]  [phone]    [facility]  [phone]    [facility]  [phone]    [facility]  [phone]    [facility]  [phone]    [facility]  [phone]

## 2016-08-02 ENCOUNTER — Encounter (HOSPITAL_COMMUNITY): Payer: Self-pay | Admitting: Obstetrics and Gynecology

## 2016-08-02 ENCOUNTER — Inpatient Hospital Stay (HOSPITAL_COMMUNITY)
Admission: AD | Admit: 2016-08-02 | Discharge: 2016-08-02 | Disposition: A | Discharge status: Home / Self Care | Payer: Medicaid Other | Source: Intra-hospital | Attending: Obstetrics & Gynecology | Admitting: Obstetrics & Gynecology

## 2016-08-02 DIAGNOSIS — B9689 Other specified bacterial agents as the cause of diseases classified elsewhere: Secondary | ICD-10-CM | POA: Insufficient documentation

## 2016-08-02 DIAGNOSIS — J45909 Unspecified asthma, uncomplicated: Secondary | ICD-10-CM | POA: Insufficient documentation

## 2016-08-02 DIAGNOSIS — Z79899 Other long term (current) drug therapy: Secondary | ICD-10-CM | POA: Insufficient documentation

## 2016-08-02 DIAGNOSIS — N760 Acute vaginitis: Secondary | ICD-10-CM | POA: Diagnosis present

## 2016-08-02 LAB — URINALYSIS, ROUTINE W REFLEX MICROSCOPIC
BILIRUBIN URINE: NEGATIVE
GLUCOSE, UA: NEGATIVE mg/dL
HGB URINE DIPSTICK: NEGATIVE
KETONES UR: NEGATIVE mg/dL
Leukocytes, UA: NEGATIVE
NITRITE: NEGATIVE
PH: 6 (ref 5.0–8.0)
Protein, ur: NEGATIVE mg/dL
SPECIFIC GRAVITY, URINE: 1.014 (ref 1.005–1.030)

## 2016-08-02 LAB — POCT PREGNANCY, URINE: Preg Test, Ur: NEGATIVE

## 2016-08-02 LAB — WET PREP, GENITAL
Sperm: NONE SEEN
TRICH WET PREP: NONE SEEN
YEAST WET PREP: NONE SEEN

## 2016-08-02 MED ORDER — METRONIDAZOLE 0.75 % VA GEL: 1.0000 | Freq: Two times a day (BID) | VAGINAL | 0 refills | Status: AC

## 2016-08-02 NOTE — MAU Note (Signed)
Pt here for vaginal discharge that she noticed several days ago. Pt states that it has an odor, but denies itching. Pt denies vaginal bleeding or pain. LMP: 06/28/2016

## 2016-08-02 NOTE — MAU Provider Note (Signed)
History     CSN: [id number]  Arrival date and time: 08/02/16 1831   None     Chief Complaint  Patient presents with  . Vaginal Discharge   HPI  [name] is a 31 yo G1P0100 non-pregnant female presenting to MAU with complaints of vaginal d/c and odor for several days.  She has not tried anything for it or be seen for her complaints.  She denies itching, VB or pain.  LMP was 06/28/2016.  Patient unsure if pregnant.  Past Medical History:  Diagnosis Date  . Asthma     Past Surgical History:  Procedure Laterality Date  . NO PAST SURGERIES      Family History  Problem Relation Age of Onset  . Other Neg Hx     Social History  Substance Use Topics  . Smoking status: Never Smoker  . Smokeless tobacco: Never Used  . Alcohol use No    Allergies: No Known Allergies  Prescriptions Prior to Admission  Medication Sig Dispense Refill Last Dose  . albuterol (PROVENTIL HFA;VENTOLIN HFA) 108 (90 BASE) MCG/ACT inhaler Inhale 2 puffs into the lungs every 6 (six) hours as needed. For cough or shortness of breath   unknown  . amoxicillin (AMOXIL) 500 MG capsule Take 1 capsule (500 mg total) by mouth 3 (three) times daily. 21 capsule 0   . ferrous sulfate (FERROUSUL) 325 (65 FE) MG tablet Take 1 tablet (325 mg total) by mouth 2 (two) times daily with a meal. (Patient not taking: Reported on 04/02/2014) 100 tablet 3 Not Taking at Unknown time  . ibuprofen (ADVIL,MOTRIN) 600 MG tablet Take 1 tablet (600 mg total) by mouth every 6 (six) hours as needed. 30 tablet 0   . metroNIDAZOLE (FLAGYL) 500 MG tablet Take 1 tablet (500 mg total) by mouth 2 (two) times daily. 14 tablet 0   . naproxen (NAPROSYN) 500 MG tablet Take 1 tablet (500 mg total) by mouth 2 (two) times daily. 20 tablet 0   . prenatal vitamin w/FE, FA (PRENATAL 1 + 1) 27-1 MG TABS tablet Take 1 tablet by mouth daily at 12 noon. (Patient not taking: Reported on 04/02/2014) 30 each 3 Completed Course at Unknown time    Review  of Systems  Constitutional: Negative.   HENT: Negative.   Eyes: Negative.   Respiratory: Negative.   Cardiovascular: Negative.   Gastrointestinal: Negative.   Endocrine: Negative.   Genitourinary: Positive for vaginal discharge (with odor). Negative for vaginal bleeding and vaginal pain.  Musculoskeletal: Negative.   Skin: Negative.   Allergic/Immunologic: Negative.   Neurological: Negative.   Hematological: Negative.   Psychiatric/Behavioral: Negative.    Physical Exam   Blood pressure 128/73, pulse 78, temperature 98.1 F (36.7 C), temperature source Oral, resp. rate 18, height 5\' 4"  (1.626 m), weight 248 lb (112.5 kg), SpO2 100 %.  Physical Exam  Constitutional: She is oriented to person, place, and time. She appears well-developed and well-nourished.  HENT:  Head: Normocephalic.  Eyes: Pupils are equal, round, and reactive to light.  Neck: Normal range of motion.  Cardiovascular: Normal rate.   Respiratory: Effort normal.  GI: Soft.  Genitourinary:  Genitourinary Comments: Wet prep & GC/CT specimens were self-collected by patient  Musculoskeletal: Normal range of motion.  Neurological: She is alert and oriented to person, place, and time.  Skin: Skin is warm and dry.  Psychiatric: She has a normal mood and affect. Her behavior is normal. Judgment and thought content normal.  MAU Course  Procedures  MDM [facility] UPT - notified of negative results Wet Prep GC/CT  Results for orders placed or performed during the hospital encounter of 08/02/16 (from the past 24 hour(s))  Urinalysis, Routine w reflex microscopic     Status: None   Collection Time: 08/02/16  7:28 PM  Result Value Ref Range   Color, Urine YELLOW YELLOW   APPearance CLEAR CLEAR   Specific Gravity, Urine 1.014 1.005 - 1.030   pH 6.0 5.0 - 8.0   Glucose, UA NEGATIVE NEGATIVE mg/dL   Hgb urine dipstick NEGATIVE NEGATIVE   Bilirubin Urine NEGATIVE NEGATIVE   Ketones, ur NEGATIVE NEGATIVE mg/dL    Protein, ur NEGATIVE NEGATIVE mg/dL   Nitrite NEGATIVE NEGATIVE   Leukocytes, UA NEGATIVE NEGATIVE  Wet prep, genital     Status: Abnormal   Collection Time: 08/02/16  7:34 PM  Result Value Ref Range   Yeast Wet Prep HPF POC NONE SEEN NONE SEEN   Trich, Wet Prep NONE SEEN NONE SEEN   Clue Cells Wet Prep HPF POC PRESENT (A) NONE SEEN   WBC, Wet Prep HPF POC FEW (A) NONE SEEN   Sperm NONE SEEN   Pregnancy, urine POC     Status: None   Collection Time: 08/02/16  7:42 PM  Result Value Ref Range   Preg Test, Ur NEGATIVE NEGATIVE    Assessment and Plan  Bacterial vaginitis - Rx for Metrogel 0.75%  - Instructions on BV and Metrogel given - Instructed to F/U with [facility] prn  Discharge home Patient verbalized an understanding of the plan of care and agrees.   Raelyn Moraolitta Karson Reede, MSN, CNM 08/02/2016, 8:39 PM

## 2016-08-03 LAB — GC/CHLAMYDIA PROBE AMP (~~LOC~~) NOT AT ARMC
Chlamydia: NEGATIVE
Neisseria Gonorrhea: NEGATIVE

## 2016-08-04 ENCOUNTER — Other Ambulatory Visit: Payer: Self-pay | Admitting: Obstetrics and Gynecology

## 2016-08-04 DIAGNOSIS — B9689 Other specified bacterial agents as the cause of diseases classified elsewhere: Secondary | ICD-10-CM

## 2016-08-04 DIAGNOSIS — N760 Acute vaginitis: Principal | ICD-10-CM

## 2016-08-04 MED ORDER — METRONIDAZOLE 500 MG PO TABS: 500.0000 mg | ORAL_TABLET | Freq: Two times a day (BID) | ORAL | 0 refills | Status: AC

## 2016-08-04 NOTE — Progress Notes (Signed)
Message received from patient requesting a Rx for Flagyl be sent to her pharmacy.  She was not able to get the Metrogel d/t the cost of the medication and her lack insurance.  Patient notified of new Rx.    Raelyn Moraolitta Patty Lopezgarcia, CNM  08/04/2016, 8:43 AM

## 2017-03-22 ENCOUNTER — Encounter (HOSPITAL_COMMUNITY): Payer: Self-pay | Admitting: *Deleted

## 2017-03-22 ENCOUNTER — Inpatient Hospital Stay (HOSPITAL_COMMUNITY)
Admission: AD | Admit: 2017-03-22 | Discharge: 2017-03-22 | Disposition: A | Discharge status: Home / Self Care | Payer: Self-pay | Source: Ambulatory Visit | Attending: Family Medicine | Admitting: Family Medicine

## 2017-03-22 DIAGNOSIS — Z79899 Other long term (current) drug therapy: Secondary | ICD-10-CM | POA: Insufficient documentation

## 2017-03-22 DIAGNOSIS — Z202 Contact with and (suspected) exposure to infections with a predominantly sexual mode of transmission: Secondary | ICD-10-CM | POA: Insufficient documentation

## 2017-03-22 LAB — WET PREP, GENITAL
Sperm: NONE SEEN
Trich, Wet Prep: NONE SEEN
Yeast Wet Prep HPF POC: NONE SEEN

## 2017-03-22 LAB — URINALYSIS, ROUTINE W REFLEX MICROSCOPIC
Bacteria, UA: NONE SEEN
Bilirubin Urine: NEGATIVE
GLUCOSE, UA: NEGATIVE mg/dL
HGB URINE DIPSTICK: NEGATIVE
Ketones, ur: NEGATIVE mg/dL
NITRITE: NEGATIVE
PH: 6 (ref 5.0–8.0)
Protein, ur: NEGATIVE mg/dL
Specific Gravity, Urine: 1.017 (ref 1.005–1.030)

## 2017-03-22 LAB — POCT PREGNANCY, URINE: PREG TEST UR: NEGATIVE

## 2017-03-22 MED ORDER — CEFTRIAXONE SODIUM 250 MG IJ SOLR
250.0000 mg | Freq: Once | INTRAMUSCULAR | Status: AC
  Administered 2017-03-22: 250 mg via INTRAMUSCULAR
  Filled 2017-03-22: qty 250

## 2017-03-22 MED ORDER — AZITHROMYCIN 250 MG PO TABS
1000.0000 mg | ORAL_TABLET | Freq: Once | ORAL | Status: AC
  Administered 2017-03-22: 1000 mg via ORAL
  Filled 2017-03-22: qty 4

## 2017-03-22 NOTE — MAU Provider Note (Signed)
Chief Complaint: Exposure to STD   First Provider Initiated Contact with Patient 03/22/17 2028      SUBJECTIVE HPI: Monique Cardenas is a 32 y.o. G1P0100 not currently pregnant who presents to maternity admissions reporting exposure to STD. She reports she had unprotected intercourse last week with a new partner, was told following intercourse that partner told her he had gonorrhea. She reports a white thin discharge with a mild odor. She denies IC with anyone else following exposure. She denies vaginal bleeding, vaginal itching/burning, urinary symptoms, h/a, dizziness, n/v, or fever/chills.     Past Medical History:  Diagnosis Date  . Asthma    Past Surgical History:  Procedure Laterality Date  . NO PAST SURGERIES     Social History   Socioeconomic History  . Marital status: Single    Spouse name: Not on file  . Number of children: Not on file  . Years of education: Not on file  . Highest education level: Not on file  Social Needs  . Financial resource strain: Not on file  . Food insecurity - worry: Not on file  . Food insecurity - inability: Not on file  . Transportation needs - medical: Not on file  . Transportation needs - non-medical: Not on file  Occupational History  . Not on file  Tobacco Use  . Smoking status: Never Smoker  . Smokeless tobacco: Never Used  Substance and Sexual Activity  . Alcohol use: No  . Drug use: No  . Sexual activity: Not Currently  Other Topics Concern  . Not on file  Social History Narrative  . Not on file   No current facility-administered medications on file prior to encounter.    Current Outpatient Medications on File Prior to Encounter  Medication Sig Dispense Refill  . albuterol (PROVENTIL HFA;VENTOLIN HFA) 108 (90 BASE) MCG/ACT inhaler Inhale 2 puffs into the lungs every 6 (six) hours as needed. For cough or shortness of breath    . ibuprofen (ADVIL,MOTRIN) 600 MG tablet Take 1 tablet (600 mg total) by mouth every 6 (six) hours as  needed. 30 tablet 0  . naproxen (NAPROSYN) 500 MG tablet Take 1 tablet (500 mg total) by mouth 2 (two) times daily. 20 tablet 0  . prenatal vitamin w/FE, FA (PRENATAL 1 + 1) 27-1 MG TABS tablet Take 1 tablet by mouth daily at 12 noon. (Patient not taking: Reported on 04/02/2014) 30 each 3   No Known Allergies  ROS:  Review of Systems  Respiratory: Negative.   Cardiovascular: Negative.   Gastrointestinal: Negative.   Genitourinary: Positive for vaginal discharge. Negative for difficulty urinating, dysuria, frequency, hematuria, urgency and vaginal bleeding.   I have reviewed patient's Past Medical Hx, Surgical Hx, Family Hx, Social Hx, medications and allergies.   Physical Exam   Patient Vitals for the past 24 hrs:  BP Temp Temp src Pulse Resp SpO2 Height Weight  03/22/17 2105 130/79 - - 87 19 - - -  03/22/17 1858 (!) 145/86 98.7 F (37.1 C) Oral 75 19 100 % 5\' 4"  (1.626 m) 253 lb (114.8 kg)   Constitutional: Well-developed, well-nourished female in no acute distress.  Cardiovascular: normal rate Respiratory: normal effort GI: Abd soft, non-tender. Pos BS x 4 MS: Extremities nontender, no edema, normal ROM Neurologic: Alert and oriented x 4.  GU: Neg CVAT.   LAB RESULTS Results for orders placed or performed during the hospital encounter of 03/22/17 (from the past 24 hour(s))  Urinalysis, Routine w reflex microscopic  Status: Abnormal   Collection Time: 03/22/17  7:00 PM  Result Value Ref Range   Color, Urine YELLOW YELLOW   APPearance HAZY (A) CLEAR   Specific Gravity, Urine 1.017 1.005 - 1.030   pH 6.0 5.0 - 8.0   Glucose, UA NEGATIVE NEGATIVE mg/dL   Hgb urine dipstick NEGATIVE NEGATIVE   Bilirubin Urine NEGATIVE NEGATIVE   Ketones, ur NEGATIVE NEGATIVE mg/dL   Protein, ur NEGATIVE NEGATIVE mg/dL   Nitrite NEGATIVE NEGATIVE   Leukocytes, UA MODERATE (A) NEGATIVE   RBC / HPF 0-5 0 - 5 RBC/hpf   WBC, UA 0-5 0 - 5 WBC/hpf   Bacteria, UA NONE SEEN NONE SEEN    Squamous Epithelial / LPF 0-5 (A) NONE SEEN   Mucus PRESENT   Wet prep, genital     Status: Abnormal   Collection Time: 03/22/17  8:20 PM  Result Value Ref Range   Yeast Wet Prep HPF POC NONE SEEN NONE SEEN   Trich, Wet Prep NONE SEEN NONE SEEN   Clue Cells Wet Prep HPF POC PRESENT (A) NONE SEEN   WBC, Wet Prep HPF POC MODERATE (A) NONE SEEN   Sperm NONE SEEN   Pregnancy, urine POC     Status: None   Collection Time: 03/22/17  8:27 PM  Result Value Ref Range   Preg Test, Ur NEGATIVE NEGATIVE    MAU Management/MDM: Orders Placed This Encounter  Procedures  . Wet prep, genital  . Urinalysis, Routine w reflex microscopic  . Pregnancy, urine POC  Wet prep- positive clue cells  GC/C- pending   Meds ordered this encounter  Medications  . AND Linked Order Group   . cefTRIAXone (ROCEPHIN) injection 250 mg     Order Specific Question:   Antibiotic Indication:     Answer:   STD   . azithromycin (ZITHROMAX) tablet 1,000 mg   Treatments in MAU included rochephin and azithromycin for gonorrhea treatment. Pt discharged. Pt stable at time of discharge.   ASSESSMENT 1. Exposure to sexually transmitted disease (STD)     PLAN Discharge home Follow up with health department if STD exposure reoccurs in the future    Allergies as of 03/22/2017   No Known Allergies     Medication List    TAKE these medications   albuterol 108 (90 Base) MCG/ACT inhaler Commonly known as:  PROVENTIL HFA;VENTOLIN HFA Inhale 2 puffs into the lungs every 6 (six) hours as needed. For cough or shortness of breath   ibuprofen 600 MG tablet Commonly known as:  ADVIL,MOTRIN Take 1 tablet (600 mg total) by mouth every 6 (six) hours as needed.   naproxen 500 MG tablet Commonly known as:  NAPROSYN Take 1 tablet (500 mg total) by mouth 2 (two) times daily.   prenatal vitamin w/FE, FA 27-1 MG Tabs tablet Take 1 tablet by mouth daily at 12 noon.       Steward Drone  Certified Nurse-Midwife 03/22/2017   9:20 PM

## 2017-03-22 NOTE — MAU Note (Signed)
[facility]. Some vaginal odor but no other symptoms.

## 2017-03-23 LAB — GC/CHLAMYDIA PROBE AMP (~~LOC~~) NOT AT ARMC
Chlamydia: NEGATIVE
Neisseria Gonorrhea: NEGATIVE

## 2017-08-13 ENCOUNTER — Inpatient Hospital Stay (HOSPITAL_COMMUNITY)
Admission: AD | Admit: 2017-08-13 | Discharge: 2017-08-13 | Disposition: A | Discharge status: Home / Self Care | Payer: Medicaid Other | Attending: Family Medicine | Admitting: Family Medicine

## 2017-08-13 DIAGNOSIS — J45909 Unspecified asthma, uncomplicated: Secondary | ICD-10-CM | POA: Insufficient documentation

## 2017-08-13 DIAGNOSIS — Z791 Long term (current) use of non-steroidal anti-inflammatories (NSAID): Secondary | ICD-10-CM | POA: Insufficient documentation

## 2017-08-13 DIAGNOSIS — B9689 Other specified bacterial agents as the cause of diseases classified elsewhere: Secondary | ICD-10-CM

## 2017-08-13 DIAGNOSIS — Z79899 Other long term (current) drug therapy: Secondary | ICD-10-CM | POA: Insufficient documentation

## 2017-08-13 DIAGNOSIS — N760 Acute vaginitis: Secondary | ICD-10-CM | POA: Insufficient documentation

## 2017-08-13 LAB — WET PREP, GENITAL
SPERM: NONE SEEN
Trich, Wet Prep: NONE SEEN
Yeast Wet Prep HPF POC: NONE SEEN

## 2017-08-13 MED ORDER — METRONIDAZOLE 500 MG PO TABS: 500.0000 mg | ORAL_TABLET | Freq: Two times a day (BID) | ORAL | 0 refills | Status: DC

## 2017-08-13 NOTE — MAU Note (Addendum)
Reports she has a yeast, has not tried OTC, reports discomfort, odor, and discharge, itching  Ongoing for a week  Recent STD exposure but unsure which STD

## 2017-08-13 NOTE — Discharge Instructions (Signed)

## 2017-08-13 NOTE — MAU Provider Note (Addendum)
Patient Monique Cardenas is a 32 y.o. G1P0100 at Unknown here with complaints of vaginal discharge for the past week. She denies dysuria, pelvic pain, abnormal bleeding or other ob-gyn complaints. She is worried about an STI.  She has some  occasional cramping.  History     CSN: [id number]  Arrival date and time: 08/13/17 0951   None     Chief Complaint  Patient presents with  . Vaginal Discharge  . Vaginal Itching   Vaginal Discharge  The patient's primary symptoms include vaginal discharge. This is a new problem. The current episode started in the past 7 days. The problem has been unchanged. Associated symptoms include urgency. Pertinent negatives include no constipation, diarrhea or flank pain. The vaginal discharge was watery. There has been no bleeding. She has not been passing clots. She has not been passing tissue.    OB History    Gravida  1   Para  1   Term  0   Preterm  1   AB  0   Living  0     SAB  0   TAB  0   Ectopic  0   Multiple  0   Live Births              Past Medical History:  Diagnosis Date  . Asthma     Past Surgical History:  Procedure Laterality Date  . NO PAST SURGERIES      Family History  Problem Relation Age of Onset  . Other Neg Hx     Social History   Tobacco Use  . Smoking status: Never Smoker  . Smokeless tobacco: Never Used  Substance Use Topics  . Alcohol use: No  . Drug use: No    Allergies: No Known Allergies  Medications Prior to Admission  Medication Sig Dispense Refill Last Dose  . albuterol (PROVENTIL HFA;VENTOLIN HFA) 108 (90 BASE) MCG/ACT inhaler Inhale 2 puffs into the lungs every 6 (six) hours as needed. For cough or shortness of breath   unknown  . ibuprofen (ADVIL,MOTRIN) 600 MG tablet Take 1 tablet (600 mg total) by mouth every 6 (six) hours as needed. 30 tablet 0   . naproxen (NAPROSYN) 500 MG tablet Take 1 tablet (500 mg total) by mouth 2 (two) times daily. 20 tablet 0   . prenatal vitamin  w/FE, FA (PRENATAL 1 + 1) 27-1 MG TABS tablet Take 1 tablet by mouth daily at 12 noon. (Patient not taking: Reported on 04/02/2014) 30 each 3 Completed Course at Unknown time    Review of Systems  Constitutional: Negative.   HENT: Negative.   Respiratory: Negative.   Cardiovascular: Negative.   Gastrointestinal: Negative.  Negative for constipation and diarrhea.  Genitourinary: Positive for urgency and vaginal discharge. Negative for flank pain.  Musculoskeletal: Negative.   Neurological: Negative.   Psychiatric/Behavioral: Negative.    Physical Exam   Blood pressure 122/75, pulse 80, temperature 98 F (36.7 C), temperature source Oral, resp. rate 18, weight 112 kg, last menstrual period 07/20/2017.  Physical Exam  Constitutional: She is oriented to person, place, and time. She appears well-developed and well-nourished.  HENT:  Head: Normocephalic.  Neck: Normal range of motion.  GI: Soft.  Neurological: She is alert and oriented to person, place, and time.  Skin: Skin is warm and dry.  Psychiatric: She has a normal mood and affect.    MAU Course  Procedures  MDM -wet prep positive for clue; GC pending.   Assessment  and Plan   1. Bacterial vaginosis    2. Patient stable for discharge with RX for Flagyl, which she may or may not fill.   3. Return to MAU if symptoms change or worsen.   Charlesetta GaribaldiKathryn Lorraine Kooistra 08/13/2017, 12:17 PM

## 2017-08-15 LAB — GC/CHLAMYDIA PROBE AMP (~~LOC~~) NOT AT ARMC
CHLAMYDIA, DNA PROBE: NEGATIVE
NEISSERIA GONORRHEA: NEGATIVE

## 2019-02-04 ENCOUNTER — Other Ambulatory Visit: Payer: Self-pay

## 2019-02-04 ENCOUNTER — Encounter (HOSPITAL_COMMUNITY): Payer: Self-pay | Admitting: Emergency Medicine

## 2019-02-04 ENCOUNTER — Emergency Department (HOSPITAL_COMMUNITY)
Admission: EM | Admit: 2019-02-04 | Discharge: 2019-02-04 | Disposition: A | Discharge status: Home / Self Care | Payer: Medicaid Other | Attending: Emergency Medicine | Admitting: Emergency Medicine

## 2019-02-04 DIAGNOSIS — K047 Periapical abscess without sinus: Secondary | ICD-10-CM | POA: Insufficient documentation

## 2019-02-04 DIAGNOSIS — K0889 Other specified disorders of teeth and supporting structures: Secondary | ICD-10-CM | POA: Insufficient documentation

## 2019-02-04 DIAGNOSIS — J45909 Unspecified asthma, uncomplicated: Secondary | ICD-10-CM | POA: Insufficient documentation

## 2019-02-04 MED ORDER — IBUPROFEN 800 MG PO TABS: 800.0000 mg | ORAL_TABLET | Freq: Three times a day (TID) | ORAL | 0 refills | Status: DC

## 2019-02-04 MED ORDER — AMOXICILLIN-POT CLAVULANATE 875-125 MG PO TABS: 1.0000 | ORAL_TABLET | Freq: Two times a day (BID) | ORAL | 0 refills | Status: AC

## 2019-02-04 MED ORDER — IBUPROFEN 800 MG PO TABS: 800.0000 mg | ORAL_TABLET | Freq: Three times a day (TID) | ORAL | 0 refills | Status: AC

## 2019-02-04 MED ORDER — AMOXICILLIN-POT CLAVULANATE 875-125 MG PO TABS: 1.0000 | ORAL_TABLET | Freq: Two times a day (BID) | ORAL | 0 refills | Status: DC

## 2019-02-04 MED ORDER — NAPROXEN 250 MG PO TABS
500.0000 mg | ORAL_TABLET | Freq: Once | ORAL | Status: AC
  Administered 2019-02-04: 500 mg via ORAL
  Filled 2019-02-04: qty 2

## 2019-02-04 NOTE — ED Triage Notes (Signed)
Patient c/o right lower dental pain, states it is her wisdom teeth. Reports pain and swelling for past few days.

## 2019-02-04 NOTE — ED Provider Notes (Signed)
[facility] EMERGENCY DEPARTMENT Provider Note   CSN: [id number] Arrival date & time: 02/04/19  1311     History Chief Complaint  Patient presents with  . Dental Pain    Monique Cardenas is a 34 y.o. female.  34 y.o female with a PMH of Asthma presents to the ED with a chief complaint of right sided dental pain x 3 days.   The history is provided by the patient.  Dental Pain Location:  Upper Upper teeth location:  16/LU 3rd molar Quality:  Aching and constant Severity:  Mild Onset quality:  Gradual Duration:  3 days Timing:  Constant Progression:  Worsening Chronicity:  Recurrent Context: abscess, dental caries, dental fracture and poor dentition   Relieved by:  Nothing Worsened by:  Touching Ineffective treatments:  NSAIDs Associated symptoms: no facial pain, no facial swelling, no fever, no neck pain, no neck swelling, no oral bleeding, no oral lesions and no trismus        Past Medical History:  Diagnosis Date  . Asthma     Patient Active Problem List   Diagnosis Date Noted  . Bacterial vaginitis 08/02/2016  . IUFD (intrauterine fetal death) September 06, 2013  . Obese 05-22-2013  . Supervision of normal first pregnancy in second trimester May 22, 2013    Past Surgical History:  Procedure Laterality Date  . NO PAST SURGERIES       OB History    Gravida  1   Para  1   Term  0   Preterm  1   AB  0   Living  0     SAB  0   TAB  0   Ectopic  0   Multiple  0   Live Births              Family History  Problem Relation Age of Onset  . Other Neg Hx     Social History   Tobacco Use  . Smoking status: Never Smoker  . Smokeless tobacco: Never Used  Substance Use Topics  . Alcohol use: No  . Drug use: No    Home Medications Prior to Admission medications   Medication Sig Start Date End Date Taking? Authorizing Provider  albuterol (PROVENTIL HFA;VENTOLIN HFA) 108 (90 BASE) MCG/ACT inhaler Inhale 2 puffs into the lungs  every 6 (six) hours as needed. For cough or shortness of breath    [provider]  amoxicillin-clavulanate (AUGMENTIN) 875-125 MG tablet Take 1 tablet by mouth 2 (two) times daily for 7 days. 02/04/19 02/11/19  Claude Manges, PA-C  ibuprofen (ADVIL) 800 MG tablet Take 1 tablet (800 mg total) by mouth 3 (three) times daily for 7 days. 02/04/19 02/11/19  Claude Manges, PA-C  ibuprofen (ADVIL,MOTRIN) 600 MG tablet Take 1 tablet (600 mg total) by mouth every 6 (six) hours as needed. 06/05/16   [name], PA-C  metroNIDAZOLE (FLAGYL) 500 MG tablet Take 1 tablet (500 mg total) by mouth 2 (two) times daily. 08/13/17   Marylene Land, CNM  naproxen (NAPROSYN) 500 MG tablet Take 1 tablet (500 mg total) by mouth 2 (two) times daily. 05/25/16   [name], NP  prenatal vitamin w/FE, FA (PRENATAL 1 + 1) 27-1 MG TABS tablet Take 1 tablet by mouth daily at 12 noon. Patient not taking: Reported on 04/02/2014 08/29/13   [name], CNM    Allergies    Patient has no known allergies.  Review of Systems   Review of Systems  Constitutional: Negative for fever.  HENT: Positive for dental problem. Negative for facial swelling and mouth sores.   Musculoskeletal: Negative for neck pain.    Physical Exam Updated Vital Signs BP (!) 131/91 (BP Location: Right Arm)   Pulse 85   Temp 98.1 F (36.7 C) (Oral)   Resp 14   Ht 5\' 4"  (1.626 m)   LMP 02/02/2019   SpO2 100%   BMI 42.40 kg/m   Physical Exam Vitals and nursing note reviewed.  Constitutional:      General: She is not in acute distress.    Appearance: She is well-developed.  HENT:     Head: Normocephalic and atraumatic.     Mouth/Throat:     Lips: Pink.     Tongue: No lesions.     Palate: No mass.     Pharynx: Uvula midline. No pharyngeal swelling or oropharyngeal exudate.      Comments: Poor dentition throughout, small dental abscess to third molar. Discoloration of the gums.  Eyes:     Pupils: Pupils are equal, round,  and reactive to light.  Cardiovascular:     Rate and Rhythm: Regular rhythm.     Heart sounds: Normal heart sounds.  Pulmonary:     Effort: Pulmonary effort is normal. No respiratory distress.     Breath sounds: Normal breath sounds.  Abdominal:     General: Bowel sounds are normal. There is no distension.     Palpations: Abdomen is soft.     Tenderness: There is no abdominal tenderness.  Musculoskeletal:        General: No tenderness or deformity.     Cervical back: Normal range of motion.     Right lower leg: No edema.     Left lower leg: No edema.  Skin:    General: Skin is warm and dry.  Neurological:     Mental Status: She is alert and oriented to person, place, and time.     ED Results / Procedures / Treatments   Labs (all labs ordered are listed, but only abnormal results are displayed) Labs Reviewed - No data to display  EKG None  Radiology No results found.  Procedures Procedures (including critical care time)  Medications Ordered in ED Medications  naproxen (NAPROSYN) tablet 500 mg (has no administration in time range)    ED Course  I have reviewed the triage vital signs and the nursing notes.  Pertinent labs & imaging results that were available during my care of the patient were reviewed by me and considered in my medical decision making (see chart for details).    MDM Rules/Calculators/A&P     Patient with a past medical history of asthma presents to the ED with complaints of dental pain for the past 3 days.  Patient reports the pain is constant, dull sensation, has similar episode in the past which she was referred to a dentist to have tooth extracted.  She is requesting no resources at the same time today.  During evaluation there is a small accumulation of pus behind her third molar, or significant pain with palpation.  Oropharynx appears clear, she is tolerating her secretions, no facial swelling, afebrile, vitals are within normal limits.  Gust  importance of dental care and follow-up with patient.  She will go home with a short prescription of Augmentin along with ibuprofen to help with her pain.  Dental resources have been attached to her chart.  Patient is aware that she will need to schedule  an appointment for further management.  Return precautions discussed at length.  Portions of this note were generated with Lobbyist. Dictation errors may occur despite best attempts at proofreading.  Final Clinical Impression(s) / ED Diagnoses Final diagnoses:  Pain, dental  Dental abscess    Rx / DC Orders ED Discharge Orders         Ordered    amoxicillin-clavulanate (AUGMENTIN) 875-125 MG tablet  2 times daily     02/04/19 1403    ibuprofen (ADVIL) 800 MG tablet  3 times daily     02/04/19 1403           Janeece Fitting, PA-C 02/04/19 1409    Charlesetta Shanks, MD 02/05/19 0930

## 2019-02-04 NOTE — Discharge Instructions (Signed)
I have provided medication to help treat your infection, please take 1 tablet twice a day for the next 7 days.  Pain medication has also been sent to the pharmacy, please take as directed.  The number to multiple dental clinics in the area are attached to your chart. Please schedule an appointment for further management.

## 2019-02-13 ENCOUNTER — Ambulatory Visit: Payer: Medicaid Other | Attending: Internal Medicine

## 2019-02-13 DIAGNOSIS — Z20822 Contact with and (suspected) exposure to covid-19: Secondary | ICD-10-CM

## 2019-02-14 LAB — NOVEL CORONAVIRUS, NAA: SARS-CoV-2, NAA: NOT DETECTED

## 2019-06-03 ENCOUNTER — Emergency Department (HOSPITAL_COMMUNITY)
Admission: EM | Admit: 2019-06-03 | Discharge: 2019-06-03 | Disposition: A | Discharge status: Home / Self Care | Payer: Medicaid Other | Attending: Emergency Medicine | Admitting: Emergency Medicine

## 2019-06-03 ENCOUNTER — Other Ambulatory Visit: Payer: Self-pay

## 2019-06-03 ENCOUNTER — Encounter (HOSPITAL_COMMUNITY): Payer: Self-pay | Admitting: Emergency Medicine

## 2019-06-03 DIAGNOSIS — Z79899 Other long term (current) drug therapy: Secondary | ICD-10-CM | POA: Insufficient documentation

## 2019-06-03 DIAGNOSIS — K047 Periapical abscess without sinus: Secondary | ICD-10-CM | POA: Insufficient documentation

## 2019-06-03 DIAGNOSIS — J45909 Unspecified asthma, uncomplicated: Secondary | ICD-10-CM | POA: Insufficient documentation

## 2019-06-03 MED ORDER — AMOXICILLIN-POT CLAVULANATE 875-125 MG PO TABS: 1.0000 | ORAL_TABLET | Freq: Two times a day (BID) | ORAL | 0 refills | Status: DC

## 2019-06-03 MED ORDER — LIDOCAINE VISCOUS HCL 2 % MT SOLN: 15.0000 mL | OROMUCOSAL | 0 refills | Status: DC | PRN

## 2019-06-03 MED ORDER — NAPROXEN 500 MG PO TABS: 500.0000 mg | ORAL_TABLET | Freq: Two times a day (BID) | ORAL | 0 refills | Status: DC

## 2019-06-03 NOTE — Discharge Instructions (Addendum)
Take antibiotics as prescribed.  Take entire course, even if your symptoms improve. Take naproxen 2 times a day with meals.  Do not take other anti-inflammatories at the same time (Advil, Motrin, ibuprofen, Aleve). You may supplement with Tylenol if you need further pain control. Use viscous lidocaine to help with pain control. Follow-up with a dentist for further evaluation and management of your teeth. Return to emergency room if you develop high fevers, severe worsening pain, inability to open your mouth, difficulty swallowing, vision changes/severe eye pain, or any new, worsening, or concerning symptoms.

## 2019-06-03 NOTE — ED Provider Notes (Signed)
[facility] EMERGENCY DEPARTMENT Provider Note   CSN: [id number] Arrival date & time: 06/03/19  1111     History Chief Complaint  Patient presents with  . Dental Pain    Monique Cardenas is a 34 y.o. female patient presenting for evaluation of dental pain.  For the past 2 to 3 days, patient has pain of her right upper tooth.  Pain is constant, and worsening.  Today she developed significant facial swelling.  She denies fevers, chills, difficulty opening mouth, or difficulty swallowing.  She has no medical problems, takes medications daily.  She does not currently have a dentist due to difficulties with insurance.  She has had issues with other teeth in her mouth, but not this one before.  No trauma to the tooth.  HPI     Past Medical History:  Diagnosis Date  . Asthma     Patient Active Problem List   Diagnosis Date Noted  . Bacterial vaginitis 08/02/2016  . IUFD (intrauterine fetal death) 09/04/2013  . Obese May 20, 2013  . Supervision of normal first pregnancy in second trimester 2013-05-20    Past Surgical History:  Procedure Laterality Date  . NO PAST SURGERIES       OB History    Gravida  1   Para  1   Term  0   Preterm  1   AB  0   Living  0     SAB  0   TAB  0   Ectopic  0   Multiple  0   Live Births              Family History  Problem Relation Age of Onset  . Other Neg Hx     Social History   Tobacco Use  . Smoking status: Never Smoker  . Smokeless tobacco: Never Used  Substance Use Topics  . Alcohol use: No  . Drug use: No    Home Medications Prior to Admission medications   Medication Sig Start Date End Date Taking? Authorizing Provider  albuterol (PROVENTIL HFA;VENTOLIN HFA) 108 (90 BASE) MCG/ACT inhaler Inhale 2 puffs into the lungs every 6 (six) hours as needed. For cough or shortness of breath    [provider]  amoxicillin-clavulanate (AUGMENTIN) 875-125 MG tablet Take 1 tablet by mouth  every 12 (twelve) hours. 06/03/19   Aneliz Carbary, PA-C  lidocaine (XYLOCAINE) 2 % solution Use as directed 15 mLs in the mouth or throat as needed for mouth pain. 06/03/19   Deeksha Cotrell, PA-C  naproxen (NAPROSYN) 500 MG tablet Take 1 tablet (500 mg total) by mouth 2 (two) times daily with a meal. 06/03/19   Wai Minotti, PA-C    Allergies    Patient has no known allergies.  Review of Systems   Review of Systems  Constitutional: Negative for fever.  HENT: Positive for dental problem and facial swelling.     Physical Exam Updated Vital Signs BP (!) 138/92 (BP Location: Right Arm)   Pulse 89   Temp 98.5 F (36.9 C) (Oral)   Resp 14   LMP 05/08/2019   SpO2 100%   Physical Exam Vitals and nursing note reviewed.  Constitutional:      General: She is not in acute distress.    Appearance: She is well-developed.     Comments: Appears nontoxic  HENT:     Head: Normocephalic and atraumatic.     Comments: Right-sided facial swelling from the lips to the zygoma.  No  erythema or induration.    Mouth/Throat:     Dentition: Abnormal dentition. Dental tenderness and dental caries present.      Comments: Overall poor dentition.  Multiple dental caries.  Several teeth missing.  Tenderness palpation of right upper teeth.  No obvious visible abscess at this time.  No trismus or malocclusion.  Handling secretions easily. Pulmonary:     Effort: Pulmonary effort is normal.  Abdominal:     General: There is no distension.  Musculoskeletal:        General: Normal range of motion.     Cervical back: Normal range of motion.  Skin:    General: Skin is warm.     Findings: No rash.  Neurological:     Mental Status: She is alert and oriented to person, place, and time.     ED Results / Procedures / Treatments   Labs (all labs ordered are listed, but only abnormal results are displayed) Labs Reviewed - No data to display  EKG None  Radiology No results  found.  Procedures Procedures (including critical care time)  Medications Ordered in ED Medications - No data to display  ED Course  I have reviewed the triage vital signs and the nursing notes.  Pertinent labs & imaging results that were available during my care of the patient were reviewed by me and considered in my medical decision making (see chart for details).    MDM Rules/Calculators/A&P                      Patient presented for evaluation of dental pain.  On exam, patient appears nontoxic.  She does have dental tenderness and swelling of the right side face, consistent with dental infection.  Exam is not consistent with Ludwig's, and doubt concerning deep space tissue infection.  Will treat with antibiotics, NSAIDs, and viscous lidocaine.  Encourage follow-up with dentistry.  At this time, patient appears safe for discharge.  Return precautions given.  Patient states she understands and agrees to plan.  Final Clinical Impression(s) / ED Diagnoses Final diagnoses:  Dental infection    Rx / DC Orders ED Discharge Orders         Ordered    amoxicillin-clavulanate (AUGMENTIN) 875-125 MG tablet  Every 12 hours     06/03/19 1247    naproxen (NAPROSYN) 500 MG tablet  2 times daily with meals     06/03/19 1247    lidocaine (XYLOCAINE) 2 % solution  As needed     06/03/19 1247           Kelissa Merlin, PA-C 06/03/19 1252    Quintella Reichert, MD 06/03/19 1654

## 2019-06-03 NOTE — ED Triage Notes (Signed)
C/o R sided dental pain with facial swelling x 2-3 days.

## 2020-06-16 ENCOUNTER — Other Ambulatory Visit: Payer: Self-pay

## 2020-06-16 ENCOUNTER — Emergency Department (HOSPITAL_COMMUNITY)
Admission: EM | Admit: 2020-06-16 | Discharge: 2020-06-16 | Disposition: A | Discharge status: Home / Self Care | Payer: Medicaid Other | Attending: Emergency Medicine | Admitting: Emergency Medicine

## 2020-06-16 DIAGNOSIS — Z5321 Procedure and treatment not carried out due to patient leaving prior to being seen by health care provider: Secondary | ICD-10-CM | POA: Insufficient documentation

## 2020-06-16 DIAGNOSIS — R195 Other fecal abnormalities: Secondary | ICD-10-CM | POA: Insufficient documentation

## 2020-06-16 LAB — COMPREHENSIVE METABOLIC PANEL
ALT: 20 U/L (ref 0–44)
AST: 17 U/L (ref 15–41)
Albumin: 3.5 g/dL (ref 3.5–5.0)
Alkaline Phosphatase: 53 U/L (ref 38–126)
Anion gap: 9 (ref 5–15)
BUN: 11 mg/dL (ref 6–20)
CO2: 23 mmol/L (ref 22–32)
Calcium: 8.9 mg/dL (ref 8.9–10.3)
Chloride: 104 mmol/L (ref 98–111)
Creatinine, Ser: 0.89 mg/dL (ref 0.44–1.00)
GFR, Estimated: >60 mL/min (ref >60)
Glucose, Bld: 99 mg/dL (ref 70–99)
Potassium: 4.1 mmol/L (ref 3.5–5.1)
Sodium: 136 mmol/L (ref 135–145)
Total Bilirubin: 0.6 mg/dL (ref 0.3–1.2)
Total Protein: 6.8 g/dL (ref 6.5–8.1)

## 2020-06-16 LAB — TYPE AND SCREEN
ABO/RH(D): B POS
Antibody Screen: NEGATIVE

## 2020-06-16 LAB — CBC
HCT: 43.6 % (ref 36.0–46.0)
Hemoglobin: 14.4 g/dL (ref 12.0–15.0)
MCH: 31.6 pg (ref 26.0–34.0)
MCHC: 33 g/dL (ref 30.0–36.0)
MCV: 95.6 fL (ref 80.0–100.0)
Platelets: 249 10*3/uL (ref 150–400)
RBC: 4.56 MIL/uL (ref 3.87–5.11)
RDW: 13.2 % (ref 11.5–15.5)
WBC: 12.5 10*3/uL — ABNORMAL HIGH (ref 4.0–10.5)
nRBC: 0 % (ref 0.0–0.2)

## 2020-06-16 LAB — I-STAT BETA HCG BLOOD, ED (MC, WL, AP ONLY): I-stat hCG, quantitative: <5 m[IU]/mL (ref <5)

## 2020-06-16 NOTE — ED Notes (Signed)
Patient states wait is too long and she is leaving 

## 2020-06-16 NOTE — ED Triage Notes (Signed)
Pt reports two episodes of BRB in stool since last night. Denies abdominal pain, lightheadedness, or weakness.

## 2021-03-22 ENCOUNTER — Emergency Department (HOSPITAL_COMMUNITY)
Admission: EM | Admit: 2021-03-22 | Discharge: 2021-03-22 | Disposition: A | Discharge status: Home / Self Care | Payer: Medicaid Other | Attending: Emergency Medicine | Admitting: Emergency Medicine

## 2021-03-22 ENCOUNTER — Encounter (HOSPITAL_COMMUNITY): Payer: Self-pay | Admitting: Emergency Medicine

## 2021-03-22 DIAGNOSIS — S39012A Strain of muscle, fascia and tendon of lower back, initial encounter: Secondary | ICD-10-CM

## 2021-03-22 DIAGNOSIS — S161XXA Strain of muscle, fascia and tendon at neck level, initial encounter: Secondary | ICD-10-CM | POA: Diagnosis not present

## 2021-03-22 DIAGNOSIS — S199XXA Unspecified injury of neck, initial encounter: Secondary | ICD-10-CM | POA: Diagnosis present

## 2021-03-22 MED ORDER — IBUPROFEN 800 MG PO TABS: 800.0000 mg | ORAL_TABLET | Freq: Three times a day (TID) | ORAL | 0 refills | Status: DC

## 2021-03-22 MED ORDER — METHOCARBAMOL 500 MG PO TABS: 500.0000 mg | ORAL_TABLET | Freq: Two times a day (BID) | ORAL | 0 refills | Status: DC
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
## 2021-05-14 DIAGNOSIS — Z0389 Encounter for observation for other suspected diseases and conditions ruled out: Secondary | ICD-10-CM | POA: Diagnosis not present

## 2021-05-14 DIAGNOSIS — Z1388 Encounter for screening for disorder due to exposure to contaminants: Secondary | ICD-10-CM | POA: Diagnosis not present

## 2021-05-14 DIAGNOSIS — Z3009 Encounter for other general counseling and advice on contraception: Secondary | ICD-10-CM | POA: Diagnosis not present

## 2021-05-23 ENCOUNTER — Encounter (HOSPITAL_COMMUNITY): Payer: Self-pay | Admitting: *Deleted

## 2021-05-23 ENCOUNTER — Other Ambulatory Visit: Payer: Self-pay

## 2021-05-23 ENCOUNTER — Emergency Department (HOSPITAL_COMMUNITY)
Admission: EM | Admit: 2021-05-23 | Discharge: 2021-05-23 | Disposition: A | Discharge status: Home / Self Care | Payer: Medicaid Other | Attending: Emergency Medicine | Admitting: Emergency Medicine

## 2021-05-23 DIAGNOSIS — K029 Dental caries, unspecified: Secondary | ICD-10-CM | POA: Insufficient documentation

## 2021-05-23 DIAGNOSIS — K047 Periapical abscess without sinus: Secondary | ICD-10-CM

## 2021-05-23 MED ORDER — OXYCODONE-ACETAMINOPHEN 5-325 MG PO TABS
2.0000 | ORAL_TABLET | Freq: Once | ORAL | Status: AC
  Administered 2021-05-23: 2 via ORAL
  Filled 2021-05-23: qty 2

## 2021-05-23 MED ORDER — NAPROXEN 500 MG PO TABS: 500.0000 mg | ORAL_TABLET | Freq: Two times a day (BID) | ORAL | 0 refills | Status: DC

## 2021-05-23 MED ORDER — LIDOCAINE VISCOUS HCL 2 % MT SOLN: 15.0000 mL | OROMUCOSAL | 0 refills | Status: DC | PRN

## 2021-05-23 MED ORDER — AMOXICILLIN-POT CLAVULANATE 875-125 MG PO TABS: 1.0000 | ORAL_TABLET | Freq: Two times a day (BID) | ORAL | 0 refills | Status: AC

## 2021-05-23 NOTE — ED Triage Notes (Signed)
Rt upper tooth pain with swelling of jaw

## 2021-05-23 NOTE — Discharge Instructions (Addendum)
You have a dental infection of the right upper tooth.  Fortunately there does not seem to be an abscess developing at this time.  I have sent you in a course of antibiotics that you will take for 10 days.  Additionally, I have sent in the lidocaine solution to help with some of the discomfort as well as naproxen, which is a pain medication similar to ibuprofen that helps with pain and swelling.  Since you have had problems with this tooth in the past, I do strongly suggest that you see a dentist.  I have attached a guide here with dental clinics that we will see patients without insurance.  I recommend that you call down the list until you can get an appointment.  I hope you feel better soon.

## 2021-05-23 NOTE — ED Provider Notes (Signed)
[facility]-EMERGENCY DEPT Provider Note   CSN: [id number] Arrival date & time: 05/23/21  2353     History  Chief Complaint  Patient presents with   Oral Swelling    Monique Cardenas is a 36 y.o. female who presents to the ED for evaluation of right upper tooth pain and swelling.  Pain first developed 2 days ago and swelling developed last night and has worsened.  Aggravated with chewing and cold air.  Patient has problems with this tooth in the past requiring antibiotics.  She notes that she does not have a dentist due to insurance difficulties.  She has been using residual viscous lidocaine with some improvement in pain.  She denies fever, chills, and all other systemic complaints.  HPI     Home Medications Prior to Admission medications   Medication Sig Start Date End Date Taking? Authorizing Provider  amoxicillin-clavulanate (AUGMENTIN) 875-125 MG tablet Take 1 tablet by mouth every 12 (twelve) hours for 10 days. 05/23/21 06/02/21 Yes Raynald Blend R, PA-C  lidocaine (XYLOCAINE) 2 % solution Use as directed 15 mLs in the mouth or throat as needed for mouth pain. 05/23/21  Yes Raynald Blend R, PA-C  naproxen (NAPROSYN) 500 MG tablet Take 1 tablet (500 mg total) by mouth 2 (two) times daily. 05/23/21  Yes Raynald Blend R, PA-C  albuterol (PROVENTIL HFA;VENTOLIN HFA) 108 (90 BASE) MCG/ACT inhaler Inhale 2 puffs into the lungs every 6 (six) hours as needed. For cough or shortness of breath    [provider]  amoxicillin-clavulanate (AUGMENTIN) 875-125 MG tablet Take 1 tablet by mouth every 12 (twelve) hours. 06/03/19   Caccavale, Sophia, PA-C  ibuprofen (ADVIL) 800 MG tablet Take 1 tablet (800 mg total) by mouth 3 (three) times daily. 03/22/21   Prosperi, Christian H, PA-C  lidocaine (XYLOCAINE) 2 % solution Use as directed 15 mLs in the mouth or throat as needed for mouth pain. 06/03/19   Caccavale, Sophia, PA-C  methocarbamol (ROBAXIN) 500 MG tablet Take 1  tablet (500 mg total) by mouth 2 (two) times daily. 03/22/21   Prosperi, Christian H, PA-C  naproxen (NAPROSYN) 500 MG tablet Take 1 tablet (500 mg total) by mouth 2 (two) times daily with a meal. 06/03/19   Caccavale, Sophia, PA-C      Allergies    Patient has no known allergies.    Review of Systems   Review of Systems  Physical Exam Updated Vital Signs BP 127/88 (BP Location: Left Arm)   Pulse 72   Temp 97.7 F (36.5 C) (Oral)   Resp 18   Ht 5\' 4"  (1.626 m)   Wt 104.3 kg   SpO2 100%   BMI 39.48 kg/m  Physical Exam Vitals and nursing note reviewed.  Constitutional:      General: She is not in acute distress.    Appearance: She is not ill-appearing.  HENT:     Head: Atraumatic.     Mouth/Throat:      Comments: Numerous dental caries and eroded teeth.  Tenderness over the right maxillary premolars.  No fluctuance or induration suggesting deep space infection.  No trismus.  Significant swelling of the right upper cheek Eyes:     Conjunctiva/sclera: Conjunctivae normal.  Cardiovascular:     Rate and Rhythm: Normal rate and regular rhythm.     Pulses: Normal pulses.     Heart sounds: No murmur heard. Pulmonary:     Effort: Pulmonary effort is normal. No respiratory distress.  Breath sounds: Normal breath sounds.  Abdominal:     General: Abdomen is flat. There is no distension.     Palpations: Abdomen is soft.     Tenderness: There is no abdominal tenderness.  Musculoskeletal:        General: Normal range of motion.     Cervical back: Normal range of motion.  Skin:    General: Skin is warm and dry.     Capillary Refill: Capillary refill takes less than 2 seconds.  Neurological:     General: No focal deficit present.     Mental Status: She is alert.  Psychiatric:        Mood and Affect: Mood normal.    ED Results / Procedures / Treatments   Labs (all labs ordered are listed, but only abnormal results are displayed) Labs Reviewed - No data to  display  EKG None  Radiology No results found.  Procedures Procedures    Medications Ordered in ED Medications  oxyCODONE-acetaminophen (PERCOCET/ROXICET) 5-325 MG per tablet 2 tablet (has no administration in time range)    ED Course/ Medical Decision Making/ A&P                           Medical Decision Making Risk Prescription drug management.   36 year old female in no acute distress, nontoxic-appearing presents to the ED for evaluation of right upper tooth pain that began 2 days ago.  Vitals are without significant abnormalities.  On exam, her right cheek is quite swollen.  Her right upper teeth are tender to palpation with redness above the gumline, but no obvious fluctuance induration suggesting abscess.  She is tolerating secretions well.  Symptoms are consistent with dental infection.  Will treat with Augmentin and discharged home with viscous lidocaine and NSAID.  Dental resource guide provided and advised patient to call down the list until she can get an appointment as the tooth needs to be treated her infection will likely return.  Pain managed acutely here in the emergency department with Percocet x2.  Patient expresses understanding and amenable to plan.  Discharged home in stable condition.  Final Clinical Impression(s) / ED Diagnoses Final diagnoses:  Dental infection    Rx / DC Orders ED Discharge Orders          Ordered    amoxicillin-clavulanate (AUGMENTIN) 875-125 MG tablet  Every 12 hours        05/23/21 0845    lidocaine (XYLOCAINE) 2 % solution  As needed        05/23/21 0845    naproxen (NAPROSYN) 500 MG tablet  2 times daily        05/23/21 0845              Janell Quiet, PA-C 05/23/21 0902    Benjiman Core, MD 05/23/21 [phone]

## 2021-07-11 ENCOUNTER — Encounter (HOSPITAL_COMMUNITY): Payer: Self-pay

## 2021-07-11 ENCOUNTER — Other Ambulatory Visit: Payer: Self-pay

## 2021-07-11 ENCOUNTER — Emergency Department (HOSPITAL_COMMUNITY)
Admission: EM | Admit: 2021-07-11 | Discharge: 2021-07-11 | Disposition: A | Discharge status: Home / Self Care | Payer: Medicaid Other | Attending: Emergency Medicine | Admitting: Emergency Medicine

## 2021-07-11 DIAGNOSIS — M5431 Sciatica, right side: Secondary | ICD-10-CM | POA: Insufficient documentation

## 2021-07-11 MED ORDER — CYCLOBENZAPRINE HCL 10 MG PO TABS
10.0000 mg | ORAL_TABLET | Freq: Once | ORAL | Status: AC
  Administered 2021-07-11: 10 mg via ORAL
  Filled 2021-07-11: qty 1

## 2021-07-11 MED ORDER — HYDROMORPHONE HCL 1 MG/ML IJ SOLN
1.0000 mg | Freq: Once | INTRAMUSCULAR | Status: AC
  Administered 2021-07-11: 1 mg via INTRAMUSCULAR
  Filled 2021-07-11: qty 1

## 2021-07-11 MED ORDER — PREDNISONE 10 MG (21) PO TBPK: ORAL_TABLET | Freq: Every day | ORAL | 0 refills | Status: DC

## 2021-07-11 NOTE — Discharge Instructions (Addendum)
Take the steroids as prescribed.  Can also try the muscle relaxer as needed.  Follow-up with your primary care doctor.  Come back to ER if you are having any numbness, weakness, bladder or bowel incontinence, fever, abdominal pain, or other new concerning symptom.

## 2021-07-11 NOTE — ED Triage Notes (Signed)
Pt c/o shooting pain down her right leg that started 2 days ago. Pt denies injury/trauma to right leg.

## 2021-07-11 NOTE — ED Provider Notes (Signed)
[facility]-EMERGENCY DEPT Provider Note   CSN: [id number] Arrival date & time: 07/11/21  1806     History  Chief Complaint  Patient presents with   Right Leg Pain    Monique Cardenas is a 36 y.o. female.  Presenting to ER due to concern for back pain.  Has been having pain that shooting down her right leg.  Going on for the past 2 days, was not sudden onset but does seem to be getting steadily worse.  No alleviating factors, has tried some over-the-counter medications with minimal relief.  No recent trauma.  No numbness, weakness, bladder or bowel incontinence. HPI     Home Medications Prior to Admission medications   Medication Sig Start Date End Date Taking? Authorizing Provider  albuterol (PROVENTIL HFA;VENTOLIN HFA) 108 (90 BASE) MCG/ACT inhaler Inhale 2 puffs into the lungs every 6 (six) hours as needed. For cough or shortness of breath    [provider]  amoxicillin-clavulanate (AUGMENTIN) 875-125 MG tablet Take 1 tablet by mouth every 12 (twelve) hours. 06/03/19   Cardenas, Sophia, PA-C  ibuprofen (ADVIL) 800 MG tablet Take 1 tablet (800 mg total) by mouth 3 (three) times daily. 03/22/21   Cardenas, Monique H, PA-C  lidocaine (XYLOCAINE) 2 % solution Use as directed 15 mLs in the mouth or throat as needed for mouth pain. 06/03/19   Cardenas, Sophia, PA-C  lidocaine (XYLOCAINE) 2 % solution Use as directed 15 mLs in the mouth or throat as needed for mouth pain. 05/23/21   Monique Quiet, PA-C  methocarbamol (ROBAXIN) 500 MG tablet Take 1 tablet (500 mg total) by mouth 2 (two) times daily. 03/22/21   Cardenas, Monique H, PA-C  naproxen (NAPROSYN) 500 MG tablet Take 1 tablet (500 mg total) by mouth 2 (two) times daily with a meal. 06/03/19   Cardenas, Sophia, PA-C  naproxen (NAPROSYN) 500 MG tablet Take 1 tablet (500 mg total) by mouth 2 (two) times daily. 05/23/21   Monique Quiet, PA-C      Allergies    Patient has no known allergies.    Review  of Systems   Review of Systems  Musculoskeletal:  Positive for back pain.  All other systems reviewed and are negative.   Physical Exam Updated Vital Signs BP 131/69 (BP Location: Right Arm)   Pulse (!) 105   Temp 97.8 F (36.6 C) (Axillary)   Resp 18   SpO2 100%  Physical Exam Vitals and nursing note reviewed.  Constitutional:      General: She is not in acute distress.    Appearance: She is well-developed.  HENT:     Head: Normocephalic and atraumatic.  Eyes:     Conjunctiva/sclera: Conjunctivae normal.  Cardiovascular:     Rate and Rhythm: Normal rate and regular rhythm.     Heart sounds: No murmur heard. Pulmonary:     Effort: Pulmonary effort is normal. No respiratory distress.     Breath sounds: Normal breath sounds.  Abdominal:     Palpations: Abdomen is soft.     Tenderness: There is no abdominal tenderness.  Musculoskeletal:        General: No swelling.     Cervical back: Neck supple.  Skin:    General: Skin is warm and dry.     Capillary Refill: Capillary refill takes less than 2 seconds.  Neurological:     Mental Status: She is alert.     Comments: 5 out of 5 strength in bilateral lower  extremities, sensation to light touch intact in bilateral lower extremities  Psychiatric:        Mood and Affect: Mood normal.     ED Results / Procedures / Treatments   Labs (all labs ordered are listed, but only abnormal results are displayed) Labs Reviewed - No data to display  EKG None  Radiology No results found.  Procedures Procedures    Medications Ordered in ED Medications  HYDROmorphone (DILAUDID) injection 1 mg (has no administration in time range)  cyclobenzaprine (FLEXERIL) tablet 10 mg (has no administration in time range)    ED Course/ Medical Decision Making/ A&P                           Medical Decision Making Risk Prescription drug management.   36 year old lady presents for back pain, right leg pain.  Description of pain is  concerning for sciatica.  She is neuro intact, she denies trauma or any red flag symptoms.  Ambulatory.  No urinary complaints, no tenderness or pain in flank region.  Provided some pain control.  Recommend course of steroids, following up with her primary care doctor.     After the discussed management above, the patient was determined to be safe for discharge.  The patient was in agreement with this plan and all questions regarding their care were answered.  ED return precautions were discussed and the patient will return to the ED with any significant worsening of condition.         Final Clinical Impression(s) / ED Diagnoses Final diagnoses:  Sciatica of right side    Rx / DC Orders ED Discharge Orders     None         Milagros Loll, MD 07/11/21 2128

## 2021-12-04 DIAGNOSIS — Z419 Encounter for procedure for purposes other than remedying health state, unspecified: Secondary | ICD-10-CM | POA: Diagnosis not present

## 2022-01-04 DIAGNOSIS — Z419 Encounter for procedure for purposes other than remedying health state, unspecified: Secondary | ICD-10-CM | POA: Diagnosis not present

## 2022-02-04 DIAGNOSIS — Z419 Encounter for procedure for purposes other than remedying health state, unspecified: Secondary | ICD-10-CM | POA: Diagnosis not present

## 2022-02-15 DIAGNOSIS — K029 Dental caries, unspecified: Secondary | ICD-10-CM | POA: Diagnosis not present

## 2022-03-05 DIAGNOSIS — Z419 Encounter for procedure for purposes other than remedying health state, unspecified: Secondary | ICD-10-CM | POA: Diagnosis not present

## 2022-03-25 ENCOUNTER — Telehealth: Payer: Self-pay

## 2022-03-25 NOTE — Telephone Encounter (Signed)
Mychart msg sent

## 2022-04-05 DIAGNOSIS — Z419 Encounter for procedure for purposes other than remedying health state, unspecified: Secondary | ICD-10-CM | POA: Diagnosis not present

## 2022-05-05 DIAGNOSIS — Z419 Encounter for procedure for purposes other than remedying health state, unspecified: Secondary | ICD-10-CM | POA: Diagnosis not present

## 2022-06-05 DIAGNOSIS — Z419 Encounter for procedure for purposes other than remedying health state, unspecified: Secondary | ICD-10-CM | POA: Diagnosis not present

## 2022-06-24 ENCOUNTER — Emergency Department (HOSPITAL_COMMUNITY)
Admission: EM | Admit: 2022-06-24 | Discharge: 2022-06-24 | Disposition: A | Discharge status: Home / Self Care | Payer: Medicaid Other | Attending: Emergency Medicine | Admitting: Emergency Medicine

## 2022-06-24 ENCOUNTER — Encounter (HOSPITAL_COMMUNITY): Payer: Self-pay

## 2022-06-24 ENCOUNTER — Emergency Department (HOSPITAL_BASED_OUTPATIENT_CLINIC_OR_DEPARTMENT_OTHER): Payer: Medicaid Other

## 2022-06-24 ENCOUNTER — Other Ambulatory Visit: Payer: Self-pay

## 2022-06-24 DIAGNOSIS — Z7952 Long term (current) use of systemic steroids: Secondary | ICD-10-CM | POA: Diagnosis not present

## 2022-06-24 DIAGNOSIS — J45909 Unspecified asthma, uncomplicated: Secondary | ICD-10-CM | POA: Diagnosis not present

## 2022-06-24 DIAGNOSIS — M79604 Pain in right leg: Secondary | ICD-10-CM

## 2022-06-24 DIAGNOSIS — M79609 Pain in unspecified limb: Secondary | ICD-10-CM | POA: Diagnosis not present

## 2022-06-24 DIAGNOSIS — Z7951 Long term (current) use of inhaled steroids: Secondary | ICD-10-CM | POA: Diagnosis not present

## 2022-06-24 LAB — CBC WITH DIFFERENTIAL/PLATELET
Abs Immature Granulocytes: 0.09 10*3/uL — ABNORMAL HIGH (ref 0.00–0.07)
Basophils Absolute: 0 10*3/uL (ref 0.0–0.1)
Basophils Relative: 0 %
Eosinophils Absolute: 0.2 10*3/uL (ref 0.0–0.5)
Eosinophils Relative: 2 %
HCT: 42.5 % (ref 36.0–46.0)
Hemoglobin: 13.6 g/dL (ref 12.0–15.0)
Immature Granulocytes: 1 %
Lymphocytes Relative: 26 %
Lymphs Abs: 3.2 10*3/uL (ref 0.7–4.0)
MCH: 31 pg (ref 26.0–34.0)
MCHC: 32 g/dL (ref 30.0–36.0)
MCV: 96.8 fL (ref 80.0–100.0)
Monocytes Absolute: 0.8 10*3/uL (ref 0.1–1.0)
Monocytes Relative: 6 %
Neutro Abs: 8 10*3/uL — ABNORMAL HIGH (ref 1.7–7.7)
Neutrophils Relative %: 65 %
Platelets: 245 10*3/uL (ref 150–400)
RBC: 4.39 MIL/uL (ref 3.87–5.11)
RDW: 13.6 % (ref 11.5–15.5)
WBC: 12.3 10*3/uL — ABNORMAL HIGH (ref 4.0–10.5)
nRBC: 0 % (ref 0.0–0.2)

## 2022-06-24 LAB — BASIC METABOLIC PANEL
Anion gap: 14 (ref 5–15)
BUN: 15 mg/dL (ref 6–20)
CO2: 20 mmol/L — ABNORMAL LOW (ref 22–32)
Calcium: 9.1 mg/dL (ref 8.9–10.3)
Chloride: 102 mmol/L (ref 98–111)
Creatinine, Ser: 0.82 mg/dL (ref 0.44–1.00)
GFR, Estimated: >60 mL/min (ref >60)
Glucose, Bld: 96 mg/dL (ref 70–99)
Potassium: 5 mmol/L (ref 3.5–5.1)
Sodium: 136 mmol/L (ref 135–145)

## 2022-06-24 MED ORDER — KETOROLAC TROMETHAMINE 15 MG/ML IJ SOLN
15.0000 mg | Freq: Once | INTRAMUSCULAR | Status: AC
  Administered 2022-06-24: 15 mg via INTRAVENOUS
  Filled 2022-06-24: qty 1

## 2022-06-24 MED ORDER — CYCLOBENZAPRINE HCL 10 MG PO TABS: 10.0000 mg | ORAL_TABLET | Freq: Two times a day (BID) | ORAL | 0 refills | Status: DC | PRN

## 2022-06-24 MED ORDER — DIAZEPAM 2 MG PO TABS
2.0000 mg | ORAL_TABLET | Freq: Once | ORAL | Status: AC
  Administered 2022-06-24: 2 mg via ORAL
  Filled 2022-06-24: qty 1

## 2022-06-24 NOTE — ED Triage Notes (Signed)
Pt reports right posterior leg pain that radiates down to her calf for the past 2 days. Pt denies any injury or trauma. Describes the pain as tight and crampy.

## 2022-06-24 NOTE — Progress Notes (Signed)
VASCULAR LAB    Right lower extremity venous duplex has been performed.  See CV proc for preliminary results.  Messaged Dr. Suezanne Jacquet negative results via secure chat  Sherren Kerns, RVT 06/24/2022, 9:12 AM

## 2022-06-24 NOTE — ED Provider Notes (Signed)
[place] EMERGENCY DEPARTMENT AT [facility] Provider Note  CSN: [id number] Arrival date & time: 06/24/22 4098  Chief Complaint(s) Leg Pain  HPI Monique Cardenas is a 37 y.o. female without significant past medical history presenting to the emergency department for right leg pain.  Reports pain for 2 days radiating to the calf.  No back pain, chest pain, shortness of breath, fevers or chills, nausea or vomiting, recent travel or surgery.  No rashes or wounds.  Took Motrin without significant improvement.   Past Medical History Past Medical History:  Diagnosis Date   Asthma    Patient Active Problem List   Diagnosis Date Noted   Bacterial vaginitis 08/02/2016   IUFD (intrauterine fetal death) 09/03/2013   Obese May 19, 2013   Supervision of normal first pregnancy in second trimester 05-19-13   Home Medication(s) Prior to Admission medications   Medication Sig Start Date End Date Taking? Authorizing Provider  cyclobenzaprine (FLEXERIL) 10 MG tablet Take 1 tablet (10 mg total) by mouth 2 (two) times daily as needed for muscle spasms. 06/24/22  Yes Lonell Grandchild, MD  albuterol (PROVENTIL HFA;VENTOLIN HFA) 108 (90 BASE) MCG/ACT inhaler Inhale 2 puffs into the lungs every 6 (six) hours as needed. For cough or shortness of breath    [provider]  amoxicillin-clavulanate (AUGMENTIN) 875-125 MG tablet Take 1 tablet by mouth every 12 (twelve) hours. 06/03/19   Caccavale, Sophia, PA-C  ibuprofen (ADVIL) 800 MG tablet Take 1 tablet (800 mg total) by mouth 3 (three) times daily. 03/22/21   Prosperi, Christian H, PA-C  lidocaine (XYLOCAINE) 2 % solution Use as directed 15 mLs in the mouth or throat as needed for mouth pain. 06/03/19   Caccavale, Sophia, PA-C  lidocaine (XYLOCAINE) 2 % solution Use as directed 15 mLs in the mouth or throat as needed for mouth pain. 05/23/21   Janell Quiet, PA-C  methocarbamol (ROBAXIN) 500 MG tablet Take 1 tablet (500 mg total) by mouth 2  (two) times daily. 03/22/21   Prosperi, Christian H, PA-C  naproxen (NAPROSYN) 500 MG tablet Take 1 tablet (500 mg total) by mouth 2 (two) times daily with a meal. 06/03/19   Caccavale, Sophia, PA-C  naproxen (NAPROSYN) 500 MG tablet Take 1 tablet (500 mg total) by mouth 2 (two) times daily. 05/23/21   Janell Quiet, PA-C  predniSONE (STERAPRED UNI-PAK 21 TAB) 10 MG (21) TBPK tablet Take by mouth daily. Take 6 tabs by mouth daily  for 1 day, then 5 tabs for 1 day, then 4 tabs for 1 day, then 3 tabs for 1 day, 2 tabs for 1 day, then 1 tab by mouth daily for 1 day 07/11/21   Milagros Loll, MD                                                                                                                                    Past Surgical History  Past Surgical History:  Procedure Laterality Date   NO PAST SURGERIES     Family History Family History  Problem Relation Age of Onset   Other Neg Hx     Social History Social History   Tobacco Use   Smoking status: Never   Smokeless tobacco: Never  Substance Use Topics   Alcohol use: No   Drug use: No   Allergies Patient has no known allergies.  Review of Systems Review of Systems  All other systems reviewed and are negative.   Physical Exam Vital Signs  I have reviewed the triage vital signs BP 122/75 (BP Location: Right Arm)   Pulse 76   Temp 98.7 F (37.1 C) (Oral)   Resp 16   Ht 5\' 4"  (1.626 m)   Wt 104.3 kg   LMP 06/05/2022   SpO2 98%   BMI 39.48 kg/m  Physical Exam Vitals and nursing note reviewed.  Constitutional:      General: She is not in acute distress.    Appearance: She is well-developed.  HENT:     Head: Normocephalic and atraumatic.     Mouth/Throat:     Mouth: Mucous membranes are moist.  Eyes:     Pupils: Pupils are equal, round, and reactive to light.  Cardiovascular:     Rate and Rhythm: Normal rate and regular rhythm.     Pulses:          Dorsalis pedis pulses are 2+ on the right side and 2+ on  the left side.     Heart sounds: No murmur heard. Pulmonary:     Effort: Pulmonary effort is normal. No respiratory distress.     Breath sounds: Normal breath sounds.  Abdominal:     General: Abdomen is flat.     Palpations: Abdomen is soft.     Tenderness: There is no abdominal tenderness.  Musculoskeletal:        General: Tenderness (mild right thigh/calf) present.     Right lower leg: No edema.     Left lower leg: No edema.  Skin:    General: Skin is warm and dry.     Findings: No rash.  Neurological:     General: No focal deficit present.     Mental Status: She is alert. Mental status is at baseline.  Psychiatric:        Mood and Affect: Mood normal.        Behavior: Behavior normal.     ED Results and Treatments Labs (all labs ordered are listed, but only abnormal results are displayed) Labs Reviewed  CBC WITH DIFFERENTIAL/PLATELET - Abnormal; Notable for the following components:      Result Value   WBC 12.3 (*)    Neutro Abs 8.0 (*)    Abs Immature Granulocytes 0.09 (*)    All other components within normal limits  BASIC METABOLIC PANEL - Abnormal; Notable for the following components:   CO2 20 (*)    All other components within normal limits  CBC WITH DIFFERENTIAL/PLATELET  Radiology VAS [place] LOWER EXTREMITY VENOUS (DVT) (7a-7p)  Result Date: 06/24/2022  Lower Venous DVT Study Patient Name:  Monique Cardenas  Date of Exam:   06/24/2022 Medical Rec #: [id number]      Accession #:    [id number] Date of Birth: [birth year]      Patient Gender: F Patient Age:   38 years Exam Location:  [facility] Procedure:      VAS [place] LOWER EXTREMITY VENOUS (DVT) Referring Phys: Alvino Blood --------------------------------------------------------------------------------  Indications: Pain radiating from hip down posterior leg to foot.  Comparison Study: No prior  study Performing Technologist: Sherren Kerns RVS  Examination Guidelines: A complete evaluation includes B-mode imaging, spectral Doppler, color Doppler, and power Doppler as needed of all accessible portions of each vessel. Bilateral testing is considered an integral part of a complete examination. Limited examinations for reoccurring indications may be performed as noted. The reflux portion of the exam is performed with the patient in reverse Trendelenburg.  +---------+---------------+---------+-----------+----------+--------------+ RIGHT    CompressibilityPhasicitySpontaneityPropertiesThrombus Aging +---------+---------------+---------+-----------+----------+--------------+ CFV      Full           Yes      Yes                                 +---------+---------------+---------+-----------+----------+--------------+ SFJ      Full                                                        +---------+---------------+---------+-----------+----------+--------------+ FV Prox  Full                                                        +---------+---------------+---------+-----------+----------+--------------+ FV Mid   Full           Yes      Yes                                 +---------+---------------+---------+-----------+----------+--------------+ FV DistalFull                                                        +---------+---------------+---------+-----------+----------+--------------+ PFV      Full                                                        +---------+---------------+---------+-----------+----------+--------------+ POP      Full           Yes      Yes                                 +---------+---------------+---------+-----------+----------+--------------+ PTV      Full                                                        +---------+---------------+---------+-----------+----------+--------------+  PERO     Full                                                         +---------+---------------+---------+-----------+----------+--------------+   +----+---------------+---------+-----------+----------+--------------+ LEFTCompressibilityPhasicitySpontaneityPropertiesThrombus Aging +----+---------------+---------+-----------+----------+--------------+ CFV Full           Yes      Yes                                 +----+---------------+---------+-----------+----------+--------------+     Summary: RIGHT: - No evidence of deep vein thrombosis in the lower extremity. No indirect evidence of obstruction proximal to the inguinal ligament. - No cystic structure found in the popliteal fossa.  LEFT: - No evidence of common femoral vein obstruction.  *See table(s) above for measurements and observations.    Preliminary     Pertinent labs & imaging results that were available during my care of the patient were reviewed by me and considered in my medical decision making (see MDM for details).  Medications Ordered in ED Medications  diazepam (VALIUM) tablet 2 mg (2 mg Oral Given 06/24/22 0803)  ketorolac (TORADOL) 15 MG/ML injection 15 mg (15 mg Intravenous Given 06/24/22 0803)                                                                                                                                     Procedures Procedures  (including critical care time)  Medical Decision Making / ED Course   MDM:  37 year old female presenting to the emergency department for right leg pain.  Patient well-appearing, exam with very mild tenderness to the right thigh.  likely cause is muscle cramp will check labs to evaluate for electrolyte disturbance.  Will give Valium and Toradol.  No redness, swelling to suggest DVT but will check DVT ultrasound.  No rashes, redness or warmth to suggest infectious process for occult infection such as abscess.  Circulation intact with normal pulses and capillary refill.  No back pain, lower concern for  radiation of pain such as sciatica.  Will reassess.  Clinical Course as of 06/24/22 1108  Thu Jun 24, 2022  1107 Workup unremarkable.  Patient does have a small leukocytosis which she has had previously and no signs of infection on exam.  Electrolytes within normal limits.  DVT ultrasound performed with no evidence of DVT.  Patient does report she feels better with medications provided in the emergency department.  Will prescribe muscle relaxer.  Suspect muscular pain.  Advise follow-up with PMD and discussed ER precautions. Will discharge patient to home. All questions answered. Patient comfortable with plan of discharge. Return precautions discussed with patient and specified on the after visit summary.  [WS]  Clinical Course User Index [WS] Lonell Grandchild, MD     Additional history obtained: -Additional history obtained from family -External records from outside source obtained and reviewed including: Chart review including previous notes, labs, imaging, consultation notes including prior ER visits   Lab Tests: -I ordered, reviewed, and interpreted labs.   The pertinent results include:   Labs Reviewed  CBC WITH DIFFERENTIAL/PLATELET - Abnormal; Notable for the following components:      Result Value   WBC 12.3 (*)    Neutro Abs 8.0 (*)    Abs Immature Granulocytes 0.09 (*)    All other components within normal limits  BASIC METABOLIC PANEL - Abnormal; Notable for the following components:   CO2 20 (*)    All other components within normal limits  CBC WITH DIFFERENTIAL/PLATELET    Notable for nonspecific leukocytosis  Imaging Studies ordered: I ordered imaging studies including DVT US  On my interpretation imaging demonstrates no DVT I independently visualized and interpreted imaging. I agree with the radiologist interpretation   Medicines ordered and prescription drug management: Meds ordered this encounter  Medications   diazepam (VALIUM) tablet 2 mg    ketorolac (TORADOL) 15 MG/ML injection 15 mg   cyclobenzaprine (FLEXERIL) 10 MG tablet    Sig: Take 1 tablet (10 mg total) by mouth 2 (two) times daily as needed for muscle spasms.    Dispense:  20 tablet    Refill:  0    -I have reviewed the patients home medicines and have made adjustments as needed   Cardiac Monitoring: The patient was maintained on a cardiac monitor.  I personally viewed and interpreted the cardiac monitored which showed an underlying rhythm of: NSR  Social Determinants of Health:  Diagnosis or treatment significantly limited by social determinants of health: obesity   Reevaluation: After the interventions noted above, I reevaluated the patient and found that their symptoms have improved  Co morbidities that complicate the patient evaluation  Past Medical History:  Diagnosis Date   Asthma       Dispostion: Disposition decision including need for hospitalization was considered, and patient discharged from emergency department.    Final Clinical Impression(s) / ED Diagnoses Final diagnoses:  Right leg pain     This chart was dictated using voice recognition software.  Despite best efforts to proofread,  errors can occur which can change the documentation meaning.    Lonell Grandchild, MD 06/24/22 [phone]

## 2022-06-24 NOTE — Discharge Instructions (Signed)
We evaluated you for your leg pain.  We checked your electrolytes which were normal.  We also obtained an ultrasound of your leg which showed no sign of a blood clot.  Your symptoms are most likely due to muscle spasm.  I have prescribed you a muscle relaxer. Please also take Tylenol and Motrin for your symptoms at home.  You can take 1000 mg of Tylenol every 6 hours and 600 mg of ibuprofen every 6 hours as needed for your symptoms.  You can take these medicines together as needed, either at the same time, or alternating every 3 hours.  Please do not drive, drink alcohol or operate machinery while taking the muscle relaxer as it may make you sleepy.  If you have any new or worsening symptoms such as fevers or chills, worsening or severe pain, increased swelling, rashes, or changes or any other concerning symptoms please return to the emergency department for reassessment.  Please follow-up with your primary doctor in 1 to 2 weeks if your symptoms are persistent.

## 2022-07-05 DIAGNOSIS — Z419 Encounter for procedure for purposes other than remedying health state, unspecified: Secondary | ICD-10-CM | POA: Diagnosis not present

## 2022-08-05 DIAGNOSIS — Z419 Encounter for procedure for purposes other than remedying health state, unspecified: Secondary | ICD-10-CM | POA: Diagnosis not present

## 2022-09-05 DIAGNOSIS — Z419 Encounter for procedure for purposes other than remedying health state, unspecified: Secondary | ICD-10-CM | POA: Diagnosis not present

## 2022-10-05 DIAGNOSIS — Z419 Encounter for procedure for purposes other than remedying health state, unspecified: Secondary | ICD-10-CM | POA: Diagnosis not present

## 2022-11-04 ENCOUNTER — Ambulatory Visit: Payer: Medicaid Other | Admitting: Family Medicine

## 2022-11-05 DIAGNOSIS — Z419 Encounter for procedure for purposes other than remedying health state, unspecified: Secondary | ICD-10-CM | POA: Diagnosis not present

## 2022-12-05 DIAGNOSIS — Z419 Encounter for procedure for purposes other than remedying health state, unspecified: Secondary | ICD-10-CM | POA: Diagnosis not present

## 2023-01-05 DIAGNOSIS — Z419 Encounter for procedure for purposes other than remedying health state, unspecified: Secondary | ICD-10-CM | POA: Diagnosis not present

## 2023-01-24 ENCOUNTER — Ambulatory Visit: Payer: Medicaid Other | Admitting: Obstetrics & Gynecology

## 2023-02-05 DIAGNOSIS — Z419 Encounter for procedure for purposes other than remedying health state, unspecified: Secondary | ICD-10-CM | POA: Diagnosis not present

## 2023-03-01 ENCOUNTER — Encounter: Payer: Self-pay | Admitting: Certified Nurse Midwife

## 2023-03-01 ENCOUNTER — Ambulatory Visit: Payer: Medicaid Other | Admitting: Certified Nurse Midwife

## 2023-03-01 ENCOUNTER — Other Ambulatory Visit (HOSPITAL_COMMUNITY)
Admission: RE | Admit: 2023-03-01 | Discharge: 2023-03-01 | Disposition: A | Discharge status: Home / Self Care | Payer: Medicaid Other | Source: Ambulatory Visit | Attending: Certified Nurse Midwife | Admitting: Certified Nurse Midwife

## 2023-03-01 VITALS — BP 122/77 | HR 73 | Wt 255.1 lb

## 2023-03-01 DIAGNOSIS — Z01419 Encounter for gynecological examination (general) (routine) without abnormal findings: Secondary | ICD-10-CM | POA: Insufficient documentation

## 2023-03-01 DIAGNOSIS — Z1331 Encounter for screening for depression: Secondary | ICD-10-CM | POA: Diagnosis not present

## 2023-03-01 DIAGNOSIS — Z124 Encounter for screening for malignant neoplasm of cervix: Secondary | ICD-10-CM

## 2023-03-01 NOTE — Patient Instructions (Signed)

## 2023-03-01 NOTE — Progress Notes (Signed)
   ANNUAL EXAM Patient name: Monique Cardenas MRN [id number]  Date of birth: February 28, 1985 Chief Complaint:   Gynecologic Exam (/)  History of Present Illness:   Monique Cardenas is a 38 y.o. G75P0100 African-American female being seen today for a routine annual exam.  Current complaints: none  Patient's last menstrual period was 02/27/2023 (approximate).   The pregnancy intention screening data noted above was reviewed. Potential methods of contraception were discussed. The patient elected to proceed with No data recorded.   Last pap  H/O abnormal pap: no Last mammogram: never/NA. Family h/o breast cancer: no Last colonoscopy: never/NA. Results were: N/A. Family h/o colorectal cancer: no      No data to display               No data to display           Review of Systems:   Pertinent items are noted in HPI Denies any headaches, blurred vision, fatigue, shortness of breath, chest pain, abdominal pain, abnormal vaginal discharge/itching/odor/irritation, problems with periods, bowel movements, urination, or intercourse unless otherwise stated above. Pertinent History Reviewed:  Reviewed past medical,surgical, social and family history.  Reviewed problem list, medications and allergies. Physical Assessment:   Vitals:   03/01/23 1307  BP: 122/77  Pulse: 73  Weight: 255 lb 1.6 oz (115.7 kg)  Body mass index is 43.79 kg/m.        Physical Examination:   General appearance - well appearing, and in no distress  Mental status - alert, oriented to person, place, and time  Psych:  She has a normal mood and affect  Skin - warm and dry, normal color, no suspicious lesions noted  Chest - effort normal, all lung fields clear to auscultation bilaterally  Heart - normal rate and regular rhythm  Neck:  midline trachea, no thyromegaly or nodules  Breasts - breasts appear normal, no suspicious masses, no skin or nipple changes or  axillary nodes  Abdomen - soft, nontender, nondistended, no  masses or organomegaly  Pelvic - VULVA: normal appearing vulva with no masses, tenderness or lesions  VAGINA: normal appearing vagina with normal color and discharge, no lesions  CERVIX: normal appearing cervix without discharge or lesions, no CMT  Thin prep pap is done with HR HPV cotesting  UTERUS: uterus is felt to be normal size, shape, consistency and nontender   ADNEXA: No adnexal masses or tenderness noted.  Rectal - deferred  Extremities:  No swelling or varicosities noted  Chaperone present for exam  No results found for this or any previous visit (from the past 24 hours).  Assessment & Plan:  1) Well-Woman Exam - Routine annual well woman exam - Does not have PCP - list provided. Labs ordered.   2) Pap today for cervical cancer screening  Labs/procedures today: Pap  Mammogram: @ 37yo, or sooner if problems Colonoscopy: @ 37yo, or sooner if problems  Orders Placed This Encounter  Procedures   CBC with Differential/Platelet   Comp Met (CMET)   Lipid panel   TSH Rfx on Abnormal to Free T4   Vitamin D (25 hydroxy)   HgB A1c    Meds: No orders of the defined types were placed in this encounter.   Follow-up: Return in about 2 months (around 04/29/2023) for Lab Visit, 1 year annual exam .  Richardson Landry, CNM 03/01/2023 1:34 PM

## 2023-03-01 NOTE — Addendum Note (Signed)
 Addended by: Brien Mates T on: 03/01/2023 01:41 PM   Modules accepted: Orders

## 2023-03-02 ENCOUNTER — Encounter: Payer: Self-pay | Admitting: Certified Nurse Midwife

## 2023-03-02 LAB — HEPATITIS C ANTIBODY: Hep C Virus Ab: NONREACTIVE

## 2023-03-02 LAB — HEPATITIS B SURFACE ANTIGEN: Hepatitis B Surface Ag: NEGATIVE

## 2023-03-02 LAB — RPR: RPR Ser Ql: NONREACTIVE

## 2023-03-03 LAB — CYTOLOGY - PAP
Chlamydia: NEGATIVE
Comment: NEGATIVE
Comment: NEGATIVE
Comment: NEGATIVE
Comment: NORMAL
Diagnosis: NEGATIVE
High risk HPV: NEGATIVE
Neisseria Gonorrhea: NEGATIVE
Trichomonas: NEGATIVE

## 2023-03-05 DIAGNOSIS — Z419 Encounter for procedure for purposes other than remedying health state, unspecified: Secondary | ICD-10-CM | POA: Diagnosis not present

## 2023-03-22 ENCOUNTER — Emergency Department (HOSPITAL_COMMUNITY)
Admission: EM | Admit: 2023-03-22 | Discharge: 2023-03-22 | Disposition: A | Discharge status: Home / Self Care | Attending: Emergency Medicine | Admitting: Emergency Medicine

## 2023-03-22 ENCOUNTER — Emergency Department (HOSPITAL_COMMUNITY)

## 2023-03-22 ENCOUNTER — Other Ambulatory Visit: Payer: Self-pay

## 2023-03-22 DIAGNOSIS — M25512 Pain in left shoulder: Secondary | ICD-10-CM | POA: Insufficient documentation

## 2023-03-22 DIAGNOSIS — R0789 Other chest pain: Secondary | ICD-10-CM | POA: Diagnosis not present

## 2023-03-22 DIAGNOSIS — R202 Paresthesia of skin: Secondary | ICD-10-CM | POA: Diagnosis not present

## 2023-03-22 DIAGNOSIS — R079 Chest pain, unspecified: Secondary | ICD-10-CM | POA: Diagnosis not present

## 2023-03-22 LAB — BASIC METABOLIC PANEL
Anion gap: 4 — ABNORMAL LOW (ref 5–15)
BUN: 9 mg/dL (ref 6–20)
CO2: 24 mmol/L (ref 22–32)
Calcium: 8.6 mg/dL — ABNORMAL LOW (ref 8.9–10.3)
Chloride: 107 mmol/L (ref 98–111)
Creatinine, Ser: 0.91 mg/dL (ref 0.44–1.00)
GFR, Estimated: >60 mL/min (ref >60)
Glucose, Bld: 113 mg/dL — ABNORMAL HIGH (ref 70–99)
Potassium: 4.2 mmol/L (ref 3.5–5.1)
Sodium: 135 mmol/L (ref 135–145)

## 2023-03-22 LAB — CBC
HCT: 42.7 % (ref 36.0–46.0)
Hemoglobin: 14.1 g/dL (ref 12.0–15.0)
MCH: 30.9 pg (ref 26.0–34.0)
MCHC: 33 g/dL (ref 30.0–36.0)
MCV: 93.6 fL (ref 80.0–100.0)
Platelets: 235 10*3/uL (ref 150–400)
RBC: 4.56 MIL/uL (ref 3.87–5.11)
RDW: 13.2 % (ref 11.5–15.5)
WBC: 9.1 10*3/uL (ref 4.0–10.5)
nRBC: 0 % (ref 0.0–0.2)

## 2023-03-22 LAB — TROPONIN I (HIGH SENSITIVITY): Troponin I (High Sensitivity): <2 ng/L (ref <18)

## 2023-03-22 LAB — HCG, SERUM, QUALITATIVE: Preg, Serum: NEGATIVE

## 2023-03-22 MED ORDER — CYCLOBENZAPRINE HCL 10 MG PO TABS
10.0000 mg | ORAL_TABLET | Freq: Two times a day (BID) | ORAL | 0 refills | Status: DC | PRN
Start: 2023-03-22 — End: 2023-11-02

## 2023-03-22 MED ORDER — IBUPROFEN 600 MG PO TABS: 600.0000 mg | ORAL_TABLET | Freq: Four times a day (QID) | ORAL | 0 refills | Status: DC | PRN

## 2023-03-22 NOTE — ED Triage Notes (Signed)
 Pt. Stated, I started having left shoulder pain and moved to my chest off and on , I was doing my regular work at [employer]. It has continued. Denies any other symptoms.

## 2023-03-22 NOTE — ED Provider Triage Note (Signed)
 Emergency Medicine Provider Triage Evaluation Note  Monique Cardenas , a 38 y.o. female  was evaluated in triage.  Pt complains of 2 days of left chest wall and shoulder discomfort.  Review of Systems  Positive: Superficial pain on her left lateral inferior chest wall going towards her shoulder.  Worsened with shoulder movement. Negative: No shortness of breath, diaphoresis, nausea, vomiting, cough, trauma, rashes or shingles, leg pain, leg swelling, abdominal pain or back pain.  Physical Exam  BP (!) 138/95 (BP Location: Right Arm)   Pulse 76   Temp 98 F (36.7 C)   Resp 18   Ht 5\' 4"  (1.626 m)   Wt 112 kg   LMP 03/22/2023 (Approximate)   SpO2 100%   BMI 42.40 kg/m  Gen:   Awake, no distress   Resp:  Normal effort  MSK:   Moves extremities without difficulty but did have some left lateral chest wall and shoulder discomfort when her arm was moved backwards stretching of the pectoral muscles.  They were tender as well. Other:  No murmur.  Lungs clear.  Anterior chest nontender, abdomen nontender.  Legs nontender.  Medical Decision Making  Medically screening exam initiated at 9:52 AM.  Appropriate orders placed.  Karlene Southard was informed that the remainder of the evaluation will be completed by another provider, this initial triage assessment does not replace that evaluation, and the importance of remaining in the ED until their evaluation is complete.  Jewels Langone is a 38 y.o. female past medical history significant for asthma who presents for evaluation of left chest discomfort.  According to patient, yesterday while at work in [employer], she started having some discomfort in her left shoulder and going into her left chest.  She reports it is worse when she moves her arm in certain directions.  She denies focal trauma but is denying any shortness of breath.  She denies nausea, diaphoresis, or back pain.  She denies any abdominal pain, leg pain, or leg swelling.  Denies any other  history.  She reports her breathing has been doing fairly well despite the environmental changes recently.  She describes as a 5 out of 10 in severity as worst and is minimal now.  On exam, patient did have some tenderness in the left lateral chest wall going towards her shoulder but the shoulder itself was not tender.  She was able to move around with some soreness in the chest but she had intact sensation of strength, and pulses.  No other tenderness on the arm itself.  No swelling seen.  Lungs clear and back nontender.  Neck nontender.    Given the patient's symptoms I suspect this is a musculoskeletal type pain and muscle soreness.  However given the location we will make sure she does not have a cardiac or pulmonary cause of symptoms.  She is PERC negative, doubt PE.  Will see what chest x-ray, EKG, and troponin show.  If workup reassuring, suspect muscle skeletal pain and she will likely be stable for discharge home with management for musculoskeletal symptoms.      Miasha Emmons, Canary Brim, MD 03/22/23 1000

## 2023-03-22 NOTE — ED Provider Notes (Signed)
 [place] [facility]MENT AT [facility] Provider Note   CSN: [id number] Arrival date & time: 03/22/23  0831     History  Chief Complaint  Patient presents with   Shoulder Pain   Chest Pain    Lakin Rhine is a 38 y.o. female.  The history is provided by the patient and medical records. No language interpreter was used.  Shoulder Pain Chest Pain    Patient is a 38 y.o. female with no significant PMH presenting to the ER today with complaints of left shoulder pain that began today while she was at work. 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 She states that the pain a sharp, shooting pain that starts in the anterior aspect of her left shoulder just below her clavicle and radiates to her left chest. She states the pain is on/off but it is reproducible with shoulder flexion and she currently rates it a 6/10 on the pain scale. The patient also denies experiencing any clicking, popping or locking of the shoulder joint and she denies previous injuries to the shoulder. She does endorse tingling that radiates from her left shoulder down to her fingertips that occurs with movement but denies numbness or weakness of the left arm/hand. She denies any associated chest tightness or pressure, SOB, palpitations, N/V.  Home Medications Prior to Admission medications   Medication Sig Start Date End Date Taking? Authorizing Provider  albuterol (PROVENTIL HFA;VENTOLIN HFA) 108 (90 BASE) MCG/ACT inhaler Inhale 2 puffs into the lungs every 6 (six) hours as needed. For cough or shortness of breath    [provider]  amoxicillin-clavulanate (AUGMENTIN) 875-125 MG tablet Take 1 tablet by mouth every 12 (twelve) hours. 06/03/19   Caccavale, Sophia, PA-C  cyclobenzaprine (FLEXERIL) 10 MG tablet Take 1 tablet (10 mg total) by mouth 2 (two)  times daily as needed for muscle spasms. 06/24/22   Lonell Grandchild, MD  ibuprofen (ADVIL) 800 MG tablet Take 1 tablet (800 mg total) by mouth 3 (three) times daily. 03/22/21   Prosperi, Christian H, PA-C  lidocaine (XYLOCAINE) 2 % solution Use as directed 15 mLs in the mouth or throat as needed for mouth pain. 06/03/19   Caccavale, Sophia, PA-C  lidocaine (XYLOCAINE) 2 % solution Use as directed 15 mLs in the mouth or throat as needed for mouth pain. 05/23/21   Janell Quiet, PA-C  methocarbamol (ROBAXIN) 500 MG tablet Take 1 tablet (500 mg total) by mouth 2 (two) times daily. 03/22/21   Prosperi, Christian H, PA-C  naproxen (NAPROSYN) 500 MG tablet Take 1 tablet (500 mg total) by mouth 2 (two) times daily with a meal. 06/03/19   Caccavale, Sophia, PA-C  naproxen (NAPROSYN) 500 MG tablet Take 1 tablet (500 mg total) by mouth 2 (two) times daily. 05/23/21   Janell Quiet, PA-C  predniSONE (STERAPRED UNI-PAK 21 TAB) 10 MG (21) TBPK tablet Take by mouth daily. Take 6 tabs by mouth daily  for 1 day, then 5 tabs for 1 day, then 4 tabs for 1 day, then 3 tabs for 1 day, 2 tabs for 1 day, then 1 tab by mouth daily for 1 day 07/11/21   Milagros Loll, MD      Allergies    Patient has no known allergies.    Review of Systems   Review  of Systems  Cardiovascular:  Positive for chest pain.  All other systems reviewed and are negative.   Physical Exam Updated Vital Signs BP (!) 138/95 (BP Location: Right Arm)   Pulse 76   Temp 98 F (36.7 C)   Resp 18   Ht 5\' 4"  (1.626 m)   Wt 112 kg   LMP 03/22/2023 (Approximate)   SpO2 100%   BMI 42.40 kg/m  Physical Exam Vitals and nursing note reviewed.  Constitutional:      General: She is not in acute distress.    Appearance: She is well-developed.  HENT:     Head: Atraumatic.  Eyes:     Conjunctiva/sclera: Conjunctivae normal.  Cardiovascular:     Rate and Rhythm: Normal rate and regular rhythm.     Pulses: Normal pulses.     Heart sounds:  Normal heart sounds.  Pulmonary:     Effort: Pulmonary effort is normal.  Abdominal:     Palpations: Abdomen is soft.  Musculoskeletal:        General: Tenderness (Mild tenderness noted to left shoulder with point tenderness to L AC joint.  shoulder with FROM.  radial pulse 2+, arm compartment soft) present.     Cervical back: Neck supple.  Skin:    Findings: No rash.  Neurological:     Mental Status: She is alert.  Psychiatric:        Mood and Affect: Mood normal.     ED Results / Procedures / Treatments   Labs (all labs ordered are listed, but only abnormal results are displayed) Labs Reviewed  BASIC METABOLIC PANEL - Abnormal; Notable for the following components:      Result Value   Glucose, Bld 113 (*)    Calcium 8.6 (*)    Anion gap 4 (*)    All other components within normal limits  CBC  HCG, SERUM, QUALITATIVE  TROPONIN I (HIGH SENSITIVITY)    EKG EKG Interpretation Date/Time:  Tuesday March 22 2023 08:43:36 EDT Ventricular Rate:  62 PR Interval:  136 QRS Duration:  86 QT Interval:  396 QTC Calculation: 401 R Axis:   37  Text Interpretation: Normal sinus rhythm Normal ECG No old tracing to compare Confirmed by Melene Plan [phone]) on 03/22/2023 10:39:03 AM  Radiology DG Chest 2 View Result Date: 03/22/2023 CLINICAL DATA:  Chest pain radiating to left shoulder. EXAM: CHEST - 2 VIEW COMPARISON:  None Available. FINDINGS: The heart size and mediastinal contours are within normal limits. Both lungs are clear. The visualized skeletal structures are unremarkable. IMPRESSION: Normal exam. Electronically Signed   By: [name] M.D.   On: 03/22/2023 09:54    Procedures Procedures    Medications Ordered in ED Medications - No data to display  ED Course/ Medical Decision Making/ A&P                                 Medical Decision Making Amount and/or Complexity of Data Reviewed Labs: ordered. Radiology: ordered.   BP (!) 138/95 (BP Location: Right Arm)    Pulse 76   Temp 98 F (36.7 C)   Resp 18   Ht 5\' 4"  (1.626 m)   Wt 112 kg   LMP 03/22/2023 (Approximate)   SpO2 100%   BMI 42.40 kg/m   11:11 AM  Patient is a 38 y.o. female with no significant PMH presenting to the ER today with complaints of left  shoulder pain that began today while she was at work. 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 She states that the pain a sharp, shooting pain that starts in the anterior aspect of her left shoulder just below her clavicle and radiates to her left chest. She states the pain is on/off but it is reproducible with shoulder flexion and she currently rates it a 6/10 on the pain scale. The patient also denies experiencing any clicking, popping or locking of the shoulder joint and she denies previous injuries to the shoulder. She does endorse tingling that radiates from her left shoulder down to her fingertips that occurs with movement but denies numbness or weakness of the left arm/hand. She denies any associated chest tightness or pressure, SOB, palpitations, N/V.  Exam overall reassuring, mild tenderness noted to left shoulder with full range of motion.  Patient is neurovascularly intact.  No tenderness with axial loading of the cervical spine.  -Labs ordered, independently viewed and interpreted by me.  Labs remarkable for normal troponin.  HEART score of 1, low risk of MACE.   -The patient was maintained on a cardiac monitor.  I personally viewed and interpreted the cardiac monitored which showed an underlying rhythm of: NSR -Imaging independently viewed and interpreted by me and I agree with radiologist's interpretation.  Result remarkable for CXR unremarkable -This patient presents to the ED for concern of shoulder pain, this involves an extensive number of treatment options, and is a complaint that  carries with it a high risk of complications and morbidity.  The differential diagnosis includes muscle strain, radicular pain, ACS, cervical sprain, fx, dislocation, cellulitis, septic joint -Co morbidities that complicate the patient evaluation includes none -Treatment includes supportive cre -Reevaluation of the patient after these medicines showed that the patient stayed the same -PCP office notes or outside notes reviewed -Escalation to admission/observation considered: patients feels much better, is comfortable with discharge, and will follow up with PCP -Prescription medication considered, patient comfortable with ibuprofen and flexeril -Social Determinant of Health considered         Final Clinical Impression(s) / ED Diagnoses Final diagnoses:  Acute pain of left shoulder    Rx / DC Orders ED Discharge Orders          Ordered    ibuprofen (ADVIL) 600 MG tablet  Every 6 hours PRN        03/22/23 1114    cyclobenzaprine (FLEXERIL) 10 MG tablet  2 times daily PRN        03/22/23 1114              Fayrene Helper, PA-C 03/22/23 1115    Melene Plan, DO 03/22/23 1116

## 2023-04-16 DIAGNOSIS — Z419 Encounter for procedure for purposes other than remedying health state, unspecified: Secondary | ICD-10-CM | POA: Diagnosis not present

## 2023-04-29 ENCOUNTER — Other Ambulatory Visit: Payer: Medicaid Other

## 2023-04-29 ENCOUNTER — Other Ambulatory Visit: Payer: Self-pay

## 2023-04-29 DIAGNOSIS — Z01419 Encounter for gynecological examination (general) (routine) without abnormal findings: Secondary | ICD-10-CM | POA: Diagnosis not present

## 2023-04-30 LAB — COMPREHENSIVE METABOLIC PANEL WITH GFR
ALT: 17 IU/L (ref 0–32)
AST: 10 IU/L (ref 0–40)
Albumin: 4.3 g/dL (ref 3.9–4.9)
Alkaline Phosphatase: 68 IU/L (ref 44–121)
BUN/Creatinine Ratio: 16 (ref 9–23)
BUN: 14 mg/dL (ref 6–20)
Bilirubin Total: 0.3 mg/dL (ref 0.0–1.2)
CO2: 23 mmol/L (ref 20–29)
Calcium: 9.5 mg/dL (ref 8.7–10.2)
Chloride: 102 mmol/L (ref 96–106)
Creatinine, Ser: 0.85 mg/dL (ref 0.57–1.00)
Globulin, Total: 2.4 g/dL (ref 1.5–4.5)
Glucose: 88 mg/dL (ref 70–99)
Potassium: 5.1 mmol/L (ref 3.5–5.2)
Sodium: 139 mmol/L (ref 134–144)
Total Protein: 6.7 g/dL (ref 6.0–8.5)
eGFR: 90 mL/min/{1.73_m2} (ref >59)

## 2023-04-30 LAB — HEMOGLOBIN A1C
Est. average glucose Bld gHb Est-mCnc: 111 mg/dL
Hgb A1c MFr Bld: 5.5 % (ref 4.8–5.6)

## 2023-04-30 LAB — TSH RFX ON ABNORMAL TO FREE T4: TSH: 1.36 u[IU]/mL (ref 0.450–4.500)

## 2023-04-30 LAB — VITAMIN D 25 HYDROXY (VIT D DEFICIENCY, FRACTURES): Vit D, 25-Hydroxy: 16.3 ng/mL — ABNORMAL LOW (ref 30.0–100.0)

## 2023-05-01 ENCOUNTER — Encounter: Payer: Self-pay | Admitting: Certified Nurse Midwife

## 2023-05-16 DIAGNOSIS — Z419 Encounter for procedure for purposes other than remedying health state, unspecified: Secondary | ICD-10-CM | POA: Diagnosis not present

## 2023-06-16 DIAGNOSIS — Z419 Encounter for procedure for purposes other than remedying health state, unspecified: Secondary | ICD-10-CM | POA: Diagnosis not present

## 2023-07-16 DIAGNOSIS — Z419 Encounter for procedure for purposes other than remedying health state, unspecified: Secondary | ICD-10-CM | POA: Diagnosis not present

## 2023-08-16 DIAGNOSIS — Z419 Encounter for procedure for purposes other than remedying health state, unspecified: Secondary | ICD-10-CM | POA: Diagnosis not present

## 2023-09-16 DIAGNOSIS — Z419 Encounter for procedure for purposes other than remedying health state, unspecified: Secondary | ICD-10-CM | POA: Diagnosis not present

## 2023-09-29 ENCOUNTER — Ambulatory Visit (HOSPITAL_COMMUNITY)
Admission: EM | Admit: 2023-09-29 | Discharge: 2023-09-29 | Disposition: A | Discharge status: Home / Self Care | Attending: Family Medicine | Admitting: Family Medicine

## 2023-09-29 ENCOUNTER — Encounter (HOSPITAL_COMMUNITY): Payer: Self-pay

## 2023-09-29 DIAGNOSIS — J00 Acute nasopharyngitis [common cold]: Secondary | ICD-10-CM | POA: Diagnosis not present

## 2023-09-29 DIAGNOSIS — R051 Acute cough: Secondary | ICD-10-CM | POA: Diagnosis not present

## 2023-09-29 DIAGNOSIS — R6889 Other general symptoms and signs: Secondary | ICD-10-CM | POA: Diagnosis not present

## 2023-09-29 LAB — POC COVID19/FLU A&B COMBO
Covid Antigen, POC: NEGATIVE
Influenza A Antigen, POC: NEGATIVE
Influenza B Antigen, POC: NEGATIVE

## 2023-09-29 MED ORDER — PROMETHAZINE-DM 6.25-15 MG/5ML PO SYRP: 5.0000 mL | ORAL_SOLUTION | Freq: Four times a day (QID) | ORAL | 0 refills | Status: DC | PRN

## 2023-09-29 NOTE — ED Triage Notes (Signed)
 Patient c/o a headache, cough, and nasal congestion x 3 days.  Patient states she has been taking Nyquil Day/Night.

## 2023-09-29 NOTE — ED Provider Notes (Signed)
 [facility]   [id number] 09/29/23 Arrival Time: 1000  ASSESSMENT & PLAN:  1. Common cold   2. Not feeling great   3. Acute cough    Discussed typical duration of likely viral illness. Results for orders placed or performed during the hospital encounter of 09/29/23  POC Covid19/Flu A&B Antigen   Collection Time: 09/29/23 11:13 AM  Result Value Ref Range   Influenza A Antigen, POC Negative Negative   Influenza B Antigen, POC Negative Negative   Covid Antigen, POC Negative Negative   OTC symptom care as needed.  Meds ordered this encounter  Medications   promethazine -dextromethorphan (PROMETHAZINE -DM) 6.25-15 MG/5ML syrup    Sig: Take 5 mLs by mouth 4 (four) times daily as needed for cough.    Dispense:  118 mL    Refill:  0   Work note provided.  Follow-up Information     [facility].   Specialty: Urgent Care Why: If worsening or failing to improve as anticipated. Contact information: [address] [place]  [address] [phone]                Reviewed expectations re: course of current medical issues. Questions answered. Outlined signs and symptoms indicating need for more acute intervention. Understanding verbalized. After Visit Summary given.   SUBJECTIVE: History from: Patient. Monique Cardenas is a 38 y.o. female. Patient c/o a headache, cough, and nasal congestion x 3 days.  Patient states she has been taking Nyquil Day/Night. Denies: fever. Normal PO intake without n/v/d.  OBJECTIVE:  Vitals:   09/29/23 1021  BP: 124/80  Pulse: 84  Resp: 16  Temp: 98.3 F (36.8 C)  TempSrc: Oral  SpO2: 97%    General appearance: alert; no distress Eyes: PERRLA; EOMI; conjunctiva normal HENT: [place]; AT; with nasal congestion Neck: supple  Lungs: speaks full sentences without difficulty; unlabored; CTAB; dry cough Extremities: no edema Skin: warm and dry Neurologic: normal gait Psychological:  alert and cooperative; normal mood and affect  Labs: Results for orders placed or performed during the hospital encounter of 09/29/23  POC Covid19/Flu A&B Antigen   Collection Time: 09/29/23 11:13 AM  Result Value Ref Range   Influenza A Antigen, POC Negative Negative   Influenza B Antigen, POC Negative Negative   Covid Antigen, POC Negative Negative   Labs Reviewed  POC COVID19/FLU A&B COMBO - Normal    Imaging: No results found.  No Known Allergies  Past Medical History:  Diagnosis Date   Asthma    Social History   Socioeconomic History   Marital status: Single    Spouse name: Not on file   Number of children: Not on file   Years of education: Not on file   Highest education level: Not on file  Occupational History   Not on file  Tobacco Use   Smoking status: Never   Smokeless tobacco: Never  Vaping Use   Vaping status: Never Used  Substance and Sexual Activity   Alcohol use: No   Drug use: No   Sexual activity: Not Currently  Other Topics Concern   Not on file  Social History Narrative   Not on file   Social Drivers of Health   Financial Resource Strain: Not on file  Food Insecurity: Not on file  Transportation Needs: Not on file  Physical Activity: Not on file  Stress: Not on file  Social Connections: Not on file  Intimate Partner Violence: Not on file   Family  History  Problem Relation Age of Onset   Sarcoidosis Mother    Other Neg Hx    Past Surgical History:  Procedure Laterality Date   NO PAST SURGERIES       Rolinda Rogue, MD 09/29/23 1124

## 2023-11-02 ENCOUNTER — Ambulatory Visit (HOSPITAL_COMMUNITY)
Admission: EM | Admit: 2023-11-02 | Discharge: 2023-11-02 | Disposition: A | Discharge status: Home / Self Care | Attending: Nurse Practitioner | Admitting: Nurse Practitioner

## 2023-11-02 ENCOUNTER — Encounter (HOSPITAL_COMMUNITY): Payer: Self-pay

## 2023-11-02 DIAGNOSIS — T148XXA Other injury of unspecified body region, initial encounter: Secondary | ICD-10-CM

## 2023-11-02 DIAGNOSIS — S46911A Strain of unspecified muscle, fascia and tendon at shoulder and upper arm level, right arm, initial encounter: Secondary | ICD-10-CM | POA: Diagnosis not present

## 2023-11-02 DIAGNOSIS — S29012A Strain of muscle and tendon of back wall of thorax, initial encounter: Secondary | ICD-10-CM | POA: Diagnosis not present

## 2023-11-02 DIAGNOSIS — W108XXA Fall (on) (from) other stairs and steps, initial encounter: Secondary | ICD-10-CM | POA: Diagnosis not present

## 2023-11-02 DIAGNOSIS — S39012A Strain of muscle, fascia and tendon of lower back, initial encounter: Secondary | ICD-10-CM

## 2023-11-02 MED ORDER — METHOCARBAMOL 500 MG PO TABS: 500.0000 mg | ORAL_TABLET | Freq: Every morning | ORAL | 0 refills | Status: AC

## 2023-11-02 MED ORDER — KETOROLAC TROMETHAMINE 60 MG/2ML IM SOLN
60.0000 mg | Freq: Once | INTRAMUSCULAR | Status: AC
  Administered 2023-11-02: 60 mg via INTRAMUSCULAR

## 2023-11-02 MED ORDER — DEXAMETHASONE SOD PHOSPHATE PF 10 MG/ML IJ SOLN
10.0000 mg | Freq: Once | INTRAMUSCULAR | Status: AC
  Administered 2023-11-02: 10 mg via INTRAMUSCULAR

## 2023-11-02 MED ORDER — NAPROXEN 500 MG PO TABS: 500.0000 mg | ORAL_TABLET | Freq: Two times a day (BID) | ORAL | 0 refills | Status: AC

## 2023-11-02 MED ORDER — KETOROLAC TROMETHAMINE 60 MG/2ML IM SOLN
INTRAMUSCULAR | Status: AC
  Filled 2023-11-02: qty 2

## 2023-11-02 MED ORDER — CYCLOBENZAPRINE HCL 10 MG PO TABS: 10.0000 mg | ORAL_TABLET | Freq: Every day | ORAL | 0 refills | Status: AC

## 2023-11-02 NOTE — ED Provider Notes (Signed)
 [facility]    CSN: [id number] Arrival date & time: 11/02/23  0945      History   Chief Complaint Chief Complaint  Patient presents with   Back Pain    HPI Monique Cardenas is a 38 y.o. female.   Discussed the use of AI scribe software for clinical note transcription with the patient, who gave verbal consent to proceed.   The patient presents with back pain that began after a fall two days ago. She reports falling down approximately 7-8 wooden steps on her friend's porch during rainy weather. The pain is localized to the right side of her back, extending from the lower back up to the right posterior shoulder region, and affects the entire right side. She describes the pain as both sharp and achy in nature. The patient has taken ibuprofen , which provided only minimal relief. She reports intermittent muscle spasms but denies any visible bruising. She denies numbness, tingling, or weakness in the legs, as well as any loss of bladder or bowel control. She also denies loss of consciousness, head injury, headache, dizziness, nausea, vomiting, or other associated injuries. The patient does not take any daily medications and has no known drug allergies.  The following sections of the patient's history were reviewed and updated as appropriate: allergies, current medications, past family history, past medical history, past social history, past surgical history, and problem list.     Past Medical History:  Diagnosis Date   Asthma     Patient Active Problem List   Diagnosis Date Noted   Bacterial vaginitis 08/02/2016   IUFD (intrauterine fetal death) 2013-09-03   Obese 2013/05/19   Supervision of normal first pregnancy in second trimester May 19, 2013    Past Surgical History:  Procedure Laterality Date   NO PAST SURGERIES      OB History     Gravida  1   Para  1   Term  0   Preterm  1   AB  0   Living  0      SAB  0   IAB  0   Ectopic  0   Multiple  0    Live Births               Home Medications    Prior to Admission medications   Medication Sig Start Date End Date Taking? Authorizing Provider  cyclobenzaprine  (FLEXERIL ) 10 MG tablet Take 1 tablet (10 mg total) by mouth at bedtime. 11/02/23  Yes Iola Lukes, FNP  methocarbamol  (ROBAXIN ) 500 MG tablet Take 1 tablet (500 mg total) by mouth every morning. 11/02/23  Yes Iola Lukes, FNP  naproxen  (NAPROSYN ) 500 MG tablet Take 1 tablet (500 mg total) by mouth 2 (two) times daily with a meal. Take with food to avoid stomach upset. Do not take any additional NSAIDs while on this. You may take tylenol  in addition to this if needed for extra pain relief. 11/02/23  Yes Iola Lukes, FNP    Family History Family History  Problem Relation Age of Onset   Sarcoidosis Mother    Other Neg Hx     Social History Social History   Tobacco Use   Smoking status: Never   Smokeless tobacco: Never  Vaping Use   Vaping status: Never Used  Substance Use Topics   Alcohol use: No   Drug use: No     Allergies   Patient has no known allergies.   Review of Systems Review of Systems  Gastrointestinal:  Negative for nausea and vomiting.  Musculoskeletal:  Positive for back pain and neck pain. Negative for gait problem.  Neurological:  Negative for dizziness, syncope, weakness, numbness and headaches.  All other systems reviewed and are negative.    Physical Exam Triage Vital Signs ED Triage Vitals [11/02/23 1138]  Encounter Vitals Group     BP 139/81     Girls Systolic BP Percentile      Girls Diastolic BP Percentile      Boys Systolic BP Percentile      Boys Diastolic BP Percentile      Pulse Rate 82     Resp 18     Temp 98.5 F (36.9 C)     Temp Source Oral     SpO2 98 %     Weight      Height      Head Circumference      Peak Flow      Pain Score      Pain Loc      Pain Education      Exclude from Growth Chart    No data found.  Updated Vital  Signs BP 139/81 (BP Location: Left Arm)   Pulse 82   Temp 98.5 F (36.9 C) (Oral)   Resp 18   LMP 10/17/2023 (Approximate)   SpO2 98%   Visual Acuity Right Eye Distance:   Left Eye Distance:   Bilateral Distance:    Right Eye Near:   Left Eye Near:    Bilateral Near:     Physical Exam Vitals reviewed.  Constitutional:      General: She is not in acute distress.    Appearance: Normal appearance. She is not ill-appearing, toxic-appearing or diaphoretic.  HENT:     Head: Normocephalic.     Mouth/Throat:     Mouth: Mucous membranes are moist.  Cardiovascular:     Rate and Rhythm: Normal rate and regular rhythm.  Pulmonary:     Effort: Pulmonary effort is normal.     Breath sounds: Normal breath sounds.  Abdominal:     Palpations: Abdomen is soft.     Tenderness: There is no right CVA tenderness or left CVA tenderness.  Musculoskeletal:        General: Normal range of motion.     Cervical back: Normal, normal range of motion and neck supple. No tenderness or bony tenderness. No pain with movement. Normal range of motion.     Thoracic back: Tenderness present. No bony tenderness.     Lumbar back: Tenderness present. No swelling, deformity, lacerations, spasms or bony tenderness. Normal range of motion. Negative right straight leg raise test and negative left straight leg raise test.       Back:  Skin:    General: Skin is warm and dry.  Neurological:     General: No focal deficit present.     Mental Status: She is alert and oriented to person, place, and time.     Cranial Nerves: Cranial nerves 2-12 are intact.     Sensory: Sensation is intact.     Motor: Motor function is intact. No weakness.     Coordination: Coordination is intact.     Gait: Gait is intact.  Psychiatric:        Mood and Affect: Mood normal.        Speech: Speech normal.        Behavior: Behavior normal. Behavior is cooperative.      UC Treatments / Results  Labs (all labs ordered are listed,  but only abnormal results are displayed) Labs Reviewed - No data to display  EKG   Radiology No results found.  Procedures Procedures (including critical care time)  Medications Ordered in UC Medications  dexamethasone (DECADRON) injection 10 mg (has no administration in time range)  ketorolac  (TORADOL ) injection 60 mg (has no administration in time range)    Initial Impression / Assessment and Plan / UC Course  I have reviewed the triage vital signs and the nursing notes.  Pertinent labs & imaging results that were available during my care of the patient were reviewed by me and considered in my medical decision making (see chart for details).     The patient presents with right-sided back pain following a fall down several steps two days ago. On evaluation, she is alert, oriented, and in mild discomfort but nontoxic and not acutely ill-appearing. Physical examination reveals right paraspinal tenderness over the thoracic and lumbar regions, as well as tenderness in the right scapular area. There is no vertebral tenderness, deformity, or step-off, and neurologic exam shows no focal deficits. Findings are most consistent with a musculoskeletal strain secondary to trauma.  The patient was reassured that no signs of fracture, neurological involvement, or serious injury were noted on exam. In the clinic, Decadron and Toradol  injections were administered for pain and inflammation control. Robaxin  was prescribed for daytime use to relieve muscle spasms, and Flexeril  was prescribed for bedtime use to aid with relaxation and sleep. Naproxen  was prescribed to take twice daily with food for ongoing pain and inflammation, with instructions to avoid additional over-the-counter NSAIDs while on this medication. Supportive measures were discussed, including alternating ice and heat to the affected area to reduce pain and muscle tension. The patient was advised to rest as needed, avoid heavy lifting, and  use caution with bending or twisting movements. She was instructed to follow up with orthopedics if symptoms do not improve within 10 days, or sooner if pain worsens, neurological symptoms develop, or new concerns arise.  Today's evaluation has revealed no signs of a dangerous process. Discussed diagnosis with patient and/or guardian. Patient and/or guardian aware of their diagnosis, possible red flag symptoms to watch out for and need for close follow up. Patient and/or guardian understands verbal and written discharge instructions. Patient and/or guardian comfortable with plan and disposition.  Patient and/or guardian has a clear mental status at this time, good insight into illness (after discussion and teaching) and has clear judgment to make decisions regarding their care  Documentation was completed with the aid of voice recognition software. Transcription may contain typographical errors.  Final Clinical Impressions(s) / UC Diagnoses   Final diagnoses:  Muscle strain  Strain of lumbar paraspinal muscle, initial encounter  Strain of thoracic back region  Muscle strain of right scapular region, initial encounter  Fall (on) (from) other stairs and steps, initial encounter     Discharge Instructions      You were seen today for back pain that started after falling down several steps two days ago. Your exam shows tenderness along the right side of your back and shoulder area, but no signs of fracture or nerve injury. Your symptoms are most consistent with a muscle strain from the fall.  You were given injections of Decadron and Toradol  in the clinic to help reduce pain and inflammation. For home care, take Naproxen  twice a day with food as prescribed, and do not take any other over-the-counter pain medications that  contain ibuprofen , Aleve , or other NSAIDs while on this medication. If needed, you may take Tylenol  (acetaminophen ) 1000 mg every six hours for additional pain relief. This equals  two 500 mg tablets at a time. Be careful not to take more than 4000 mg of Tylenol  in a 24-hour period. Robaxin  can be taken during the day for muscle spasms, and Flexeril  should be taken at bedtime to help with pain and muscle relaxation. Avoid driving or operating machinery after taking Flexeril , as it can cause drowsiness.  To help manage symptoms, alternate between applying ice and heat to the sore area for 15-20 minutes at a time several times per day. Ice helps reduce inflammation, while heat can relax tense muscles. Gentle stretching and light movement may also help prevent stiffness, but avoid heavy lifting, bending, or twisting until your pain improves. Rest when needed, and maintain good hydration.  Follow up with an orthopedic specialist or your primary care provider if your pain does not start to improve within about 10 days, or sooner if symptoms worsen. Seek medical attention right away if you develop new numbness, tingling, weakness in your legs, loss of bladder or bowel control, worsening pain that does not improve with medication, or any new or concerning symptoms.     ED Prescriptions     Medication Sig Dispense Auth. Provider   naproxen  (NAPROSYN ) 500 MG tablet Take 1 tablet (500 mg total) by mouth 2 (two) times daily with a meal. Take with food to avoid stomach upset. Do not take any additional NSAIDs while on this. You may take tylenol  in addition to this if needed for extra pain relief. 20 tablet Iola Lukes, FNP   cyclobenzaprine  (FLEXERIL ) 10 MG tablet Take 1 tablet (10 mg total) by mouth at bedtime. 10 tablet Iola Lukes, FNP   methocarbamol  (ROBAXIN ) 500 MG tablet Take 1 tablet (500 mg total) by mouth every morning. 10 tablet Iola Lukes, FNP      PDMP not reviewed this encounter.   Iola Lukes, [place] 11/02/23 1254

## 2023-11-02 NOTE — Discharge Instructions (Addendum)
 You were seen today for back pain that started after falling down several steps two days ago. Your exam shows tenderness along the right side of your back and shoulder area, but no signs of fracture or nerve injury. Your symptoms are most consistent with a muscle strain from the fall.  You were given injections of Decadron and Toradol  in the clinic to help reduce pain and inflammation. For home care, take Naproxen  twice a day with food as prescribed, and do not take any other over-the-counter pain medications that contain ibuprofen , Aleve , or other NSAIDs while on this medication. If needed, you may take Tylenol  (acetaminophen ) 1000 mg every six hours for additional pain relief. This equals two 500 mg tablets at a time. Be careful not to take more than 4000 mg of Tylenol  in a 24-hour period. Robaxin  can be taken during the day for muscle spasms, and Flexeril  should be taken at bedtime to help with pain and muscle relaxation. Avoid driving or operating machinery after taking Flexeril , as it can cause drowsiness.  To help manage symptoms, alternate between applying ice and heat to the sore area for 15-20 minutes at a time several times per day. Ice helps reduce inflammation, while heat can relax tense muscles. Gentle stretching and light movement may also help prevent stiffness, but avoid heavy lifting, bending, or twisting until your pain improves. Rest when needed, and maintain good hydration.  Follow up with an orthopedic specialist or your primary care provider if your pain does not start to improve within about 10 days, or sooner if symptoms worsen. Seek medical attention right away if you develop new numbness, tingling, weakness in your legs, loss of bladder or bowel control, worsening pain that does not improve with medication, or any new or concerning symptoms.

## 2023-11-02 NOTE — ED Triage Notes (Signed)
 Pt c/o lower back pain after falling down wooden steps. No LOC. She did not hit her head.

## 2023-11-15 ENCOUNTER — Encounter: Payer: Self-pay | Admitting: Internal Medicine

## 2023-11-15 ENCOUNTER — Ambulatory Visit: Admitting: Internal Medicine

## 2023-11-15 ENCOUNTER — Other Ambulatory Visit: Payer: Self-pay

## 2023-11-15 VITALS — BP 116/68 | HR 92 | Temp 98.9°F | Ht 64.25 in | Wt 255.8 lb

## 2023-11-15 DIAGNOSIS — J4520 Mild intermittent asthma, uncomplicated: Secondary | ICD-10-CM

## 2023-11-15 DIAGNOSIS — J3089 Other allergic rhinitis: Secondary | ICD-10-CM

## 2023-11-15 MED ORDER — CETIRIZINE HCL 10 MG PO TABS: 10.0000 mg | ORAL_TABLET | Freq: Every day | ORAL | 5 refills | Status: AC

## 2023-11-15 MED ORDER — ALBUTEROL SULFATE HFA 108 (90 BASE) MCG/ACT IN AERS: 2.0000 | INHALATION_SPRAY | Freq: Four times a day (QID) | RESPIRATORY_TRACT | 1 refills | Status: AC | PRN

## 2023-11-15 NOTE — Patient Instructions (Addendum)
 Mild Intermittent Asthma: - Rescue inhaler: Albuterol 2 puffs or 1 vial via nebulizer every 4-6 hours as needed for respiratory symptoms of cough, shortness of breath, or wheezing Asthma control goals:  Full participation in all desired activities (may need albuterol before activity) Albuterol use two times or less a week on average (not counting use with activity) Cough interfering with sleep two times or less a month Oral steroids no more than once a year No hospitalizations   Other Allergic Rhinitis:  - Use nasal saline rinses before nose sprays such as with Neilmed Sinus Rinse.  Use distilled water.   - Use Zyrtec 10 mg daily as needed for runny nose, sneezing, itchy watery      Hold all anti-histamines (Xyzal, Allegra, Zyrtec, Claritin, Benadryl , Pepcid ) 3 days prior to next visit.   Follow up: 11/18 at 9 AM for skin testing 1-55

## 2023-11-15 NOTE — Progress Notes (Signed)
 NEW PATIENT  Date of Service/Encounter:  11/15/23  Consult requested by: Patient, No Pcp Per   Subjective:   Monique Cardenas (DOB: 11-09-1985) is a 38 y.o. female who presents to the clinic on 11/15/2023 with a chief complaint of Allergies (Yearly. Symptoms include: runny nose, sneezing, watery eyes, itchy throat) and Asthma (In winter months) .    History obtained from: chart review and patient.   Asthma:  Diagnosed at a very young age.  Mostly well controlled, only has some trouble with viral infections or cold weather requiring use of Albuterol PRN.  Never more than a few days a week.  Using rescue inhaler: 2x/week or less Limitations to daily activity: none 0 ED visits/UC visits and 0 oral steroids in the past year 0 number of lifetime hospitalizations, 0 number of lifetime intubations.  Identified Triggers: respiratory illness and cold air Prior PFTs or spirometry: none Previously used therapies: none  Current regimen:  Maintenance: none  Rescue: Albuterol 2 puffs q4-6 hrs PRN  Rhinitis:  Started in teenage years.  It has worsened as she has gotten older.  Symptoms include: nasal congestion, rhinorrhea, post nasal drainage, watery eyes, and itchy eyes  Occurs year-round with seasonal flares in Summer/Fall  Potential triggers: not sure   Treatments tried:  Benadryl  or claritin PRN  Previous allergy testing: no History of sinus surgery: no Nonallergic triggers: none      Reviewed:  11/02/2203: seen in urgent care for back pain, given toradol  + decadron injection. No hx of drug allergies.  09/29/2023: seen in urgent care for cough, congestion, headaches x3 days. Symptomatic care at home for viral URI.   03/01/2023: seen by [facility] at [facility] for well woman exam and screening. No significant PMH/medications.   Past Medical History: Past Medical History:  Diagnosis Date   Asthma    Eczema     Past Surgical History: Past Surgical History:  Procedure  Laterality Date   NO PAST SURGERIES      Family History: Family History  Problem Relation Age of Onset   Sarcoidosis Mother    Healthy Father    Other Neg Hx     Social History:  Flooring in bedroom: wood Pets: none Tobacco use/exposure: smoked for 6 years as teenagers; highest was 1/2 ppd; quit 5 years ago. Job: conservation officer, nature at school   Medication List:  Allergies as of 11/15/2023   No Known Allergies      Medication List        Accurate as of November 15, 2023  2:26 PM. If you have any questions, ask your nurse or doctor.          albuterol 108 (90 Base) MCG/ACT inhaler Commonly known as: VENTOLIN HFA Inhale 2 puffs into the lungs every 6 (six) hours as needed for shortness of breath or wheezing. Started by: Arleta SHAUNNA Blanch   cetirizine 10 MG tablet Commonly known as: ZyrTEC Allergy Take 1 tablet (10 mg total) by mouth daily. Started by: Arleta SHAUNNA Blanch   cyclobenzaprine  10 MG tablet Commonly known as: FLEXERIL  Take 1 tablet (10 mg total) by mouth at bedtime.   methocarbamol  500 MG tablet Commonly known as: ROBAXIN  Take 1 tablet (500 mg total) by mouth every morning.   naproxen  500 MG tablet Commonly known as: NAPROSYN  Take 1 tablet (500 mg total) by mouth 2 (two) times daily with a meal. Take with food to avoid stomach upset. Do not take any additional NSAIDs while on this. You may take  tylenol  in addition to this if needed for extra pain relief.         REVIEW OF SYSTEMS: Pertinent positives and negatives discussed in HPI.   Objective:   Physical Exam: BP 116/68 (BP Location: Right Arm, Patient Position: Sitting, Cuff Size: Large)   Pulse 92   Temp 98.9 F (37.2 C) (Temporal)   Ht 5' 4.25 (1.632 m)   Wt 255 lb 12.8 oz (116 kg)   LMP 10/17/2023 (Approximate)   SpO2 99%   BMI 43.57 kg/m  Body mass index is 43.57 kg/m. GEN: alert, well developed HEENT: clear conjunctiva, nose with + moderate inferior turbinate hypertrophy, boggy nasal mucosa, +  clear rhinorrhea, + cobblestoning HEART: regular rate and rhythm, no murmur LUNGS: clear to auscultation bilaterally, no coughing, unlabored respiration ABDOMEN: soft, non distended  SKIN: no rashes or lesions  Spirometry:  Tracings reviewed. Her effort: It was hard to get consistent efforts and there is a question as to whether this reflects a maximal maneuver. FVC: 2.61L, 81% predicted FEV1: 2.43L, 91% predicted FEV1/FVC ratio: 93% Interpretation: Spirometry consistent with normal pattern.  Please see scanned spirometry results for details.  Assessment:   1. Other allergic rhinitis   2. Mild intermittent asthma without complication     Plan/Recommendations:  Mild Intermittent Asthma: - MDI technique discussed.  Spirometry today was normal. Infrequent symptoms, mostly with viral illness or cold weather. - Maintenance inhaler: none - Rescue inhaler: Albuterol 2 puffs or 1 vial via nebulizer every 4-6 hours as needed for respiratory symptoms of cough, shortness of breath, or wheezing Asthma control goals:  Full participation in all desired activities (may need albuterol before activity) Albuterol use two times or less a week on average (not counting use with activity) Cough interfering with sleep two times or less a month Oral steroids no more than once a year No hospitalizations  Other Allergic Rhinitis: - Due to turbinate hypertrophy, seasonal flares up, asthma and unresponsive to over the counter meds, will perform skin testing to identify aeroallergen triggers.   - Use nasal saline rinses before nose sprays such as with Neilmed Sinus Rinse.  Use distilled water.   - Use Zyrtec 10 mg daily as needed for runny nose, sneezing, itchy watery   Hold all anti-histamines (Xyzal, Allegra, Zyrtec, Claritin, Benadryl , Pepcid ) 3 days prior to next visit.   Follow up: 11/18 at 9 AM for skin testing 1-55, IDs okay    Arleta Blanch, MD [facility]

## 2023-11-17 NOTE — Addendum Note (Signed)
 Addended by: AZALEA, Debbra Digiulio on: 11/17/2023 01:58 PM   Modules accepted: Orders

## 2023-11-22 ENCOUNTER — Ambulatory Visit: Admitting: Internal Medicine

## 2023-11-22 DIAGNOSIS — J3089 Other allergic rhinitis: Secondary | ICD-10-CM | POA: Diagnosis not present

## 2023-11-22 DIAGNOSIS — J3081 Allergic rhinitis due to animal (cat) (dog) hair and dander: Secondary | ICD-10-CM

## 2023-11-22 DIAGNOSIS — J301 Allergic rhinitis due to pollen: Secondary | ICD-10-CM

## 2023-11-22 MED ORDER — AZELASTINE HCL 0.1 % NA SOLN: 2.0000 | Freq: Two times a day (BID) | NASAL | 5 refills | Status: DC | PRN

## 2023-11-22 MED ORDER — FLUTICASONE PROPIONATE 50 MCG/ACT NA SUSP: 2.0000 | Freq: Every day | NASAL | 5 refills | Status: DC

## 2023-11-22 NOTE — Progress Notes (Signed)
 FOLLOW UP Date of Service/Encounter:  11/22/23   Subjective:  Monique Cardenas (DOB: 06/20/1985) is a 38 y.o. female [facility] for follow up for skin testing.   History obtained from: chart review and patient.  Anti histamines held.   Past Medical History: Past Medical History:  Diagnosis Date   Asthma    Eczema     Objective:  LMP 10/17/2023 (Approximate)  There is no height or weight on file to calculate BMI. Physical Exam: GEN: alert, well developed HEENT: clear conjunctiva, MMM LUNGS: unlabored respiration   Skin Testing:  Skin prick testing was placed, which includes aeroallergens/foods, histamine control, and saline control.  Verbal consent was obtained prior to placing test.  Patient tolerated procedure well.  Allergy testing results were read and interpreted by myself, documented by clinical staff. Adequate positive and negative control.  Positive results to:  Results discussed with patient/family.  Airborne Adult Perc - 11/22/23 0852     Time Antigen Placed 9147    Allergen Manufacturer Jestine    Location Back    Number of Test 55    1. Control-Buffer 50% Glycerol Negative    2. Control-Histamine 3+    3. Bahia Negative    4. Bermuda Negative    5. Johnson Negative    6. [place]  Blue Negative    7. Meadow Fescue Negative    8. Perennial Rye Negative    9. Timothy Negative    10. Ragweed Mix 3+    11. Cocklebur 2+    12. Plantain,  English Negative    13. Baccharis Negative    14. Dog Fennel Negative    15. Russian Thistle Negative    16. Lamb's Quarters Negative    17. Sheep Sorrell Negative    18. Rough Pigweed Negative    19. Marsh Elder, Rough Negative    20. Mugwort, Common Negative    22. Cedar, red Negative    23. Sweet Gum 3+    24. Pecan Pollen 2+    25. Pine Mix Negative    26. Walnut, Black Pollen Negative    27. Red Mulberry 3+    28. Ash Mix 2+    29. Birch Mix 3+    30. Beech  American 3+    31. Cottonwood, Eastern Negative    32. Hickory, White 3+    34. Oak, Eastern Mix Negative    35. Sycamore Eastern 2+    36. Alternaria Alternata Negative    37. Cladosporium Herbarum Negative    38. Aspergillus Mix Negative    39. Penicillium Mix Negative    40. Bipolaris Sorokiniana (Helminthosporium) Negative    41. Drechslera Spicifera (Curvularia) Negative    42. Mucor Plumbeus Negative    43. Fusarium Moniliforme Negative    44. Aureobasidium Pullulans (pullulara) Negative    45. Rhizopus Oryzae Negative    46. Botrytis Cinera Negative    47. Epicoccum Nigrum Negative    48. Phoma Betae Negative    49. Dust Mite Mix 3+    50. Cat Hair 10,000 BAU/ml 3+    51.  Dog Epithelia Negative    52. Mixed Feathers Negative    53. Horse Epithelia Negative    54. Cockroach, German Negative    55. Tobacco Leaf Negative          Intradermal - 11/22/23 0932     Time Antigen Placed 0932    Allergen Manufacturer Jestine  Location Arm    Number of Test 11    Control Negative    Bahia 3+    Bermuda 3+    Johnson Negative    7 Grass 3+    Mold 1 3+    Mold 2 3+    Mold 3 3+    Mold 4 2+    Dog 2+    Cockroach 2+           Assessment:   1. Seasonal allergic rhinitis due to pollen   2. Allergic rhinitis caused by mold   3. Allergic rhinitis due to animal hair or dander   4. Allergic rhinitis due to dust mite   5. Allergic rhinitis due to insect     Plan/Recommendations:  Allergic Rhinitis: - Due to turbinate hypertrophy, seasonal flares up, asthma and unresponsive to over the counter meds, will perform skin testing to identify aeroallergen triggers.   - Positive skin test 11/2023: trees, grasses, weeds, molds, dust mites, cats, dogs, cockroach  - Avoidance measures discussed. - Use nasal saline rinses before nose sprays such as with Neilmed Sinus Rinse.  Use distilled water.   - Use Flonase 2 sprays each nostril daily. Aim upward and outward. - Use  Azelastine 2 sprays each nostril twice daily as needed for runny nose, drainage, sneezing, congestion. Aim upward and outward. - Use Zyrtec 10 mg daily.  - Consider allergy shots as long term control of your symptoms by teaching your immune system to be more tolerant of your allergy triggers   Mild Intermittent Asthma: - Maintenance inhaler: none - Rescue inhaler: Albuterol 2 puffs or 1 vial via nebulizer every 4-6 hours as needed for respiratory symptoms of cough, shortness of breath, or wheezing Asthma control goals:  Full participation in all desired activities (may need albuterol before activity) Albuterol use two times or less a week on average (not counting use with activity) Cough interfering with sleep two times or less a month Oral steroids no more than once a year No hospitalizations    ALLERGEN AVOIDANCE MEASURES   Dust Mites Use central air conditioning and heat; and change the filter monthly.  Pleated filters work better than mesh filters.  Electrostatic filters may also be used; wash the filter monthly.  Window air conditioners may be used, but do not clean the air as well as a central air conditioner.  Change or wash the filter monthly. Keep windows closed.  Do not use attic fans.   Encase the mattress, box springs and pillows with zippered, dust proof covers. Wash the bed linens in hot water weekly.   Remove carpet, especially from the bedroom. Remove stuffed animals, throw pillows, dust ruffles, heavy drapes and other items that collect dust from the bedroom. Do not use a humidifier.   Use wood, vinyl or leather furniture instead of cloth furniture in the bedroom. Keep the indoor humidity at 30 - 40%.    Molds - Indoor avoidance Use air conditioning to reduce indoor humidity.  Do not use a humidifier. Keep indoor humidity at 30 - 40%.  Use a dehumidifier if needed. In the bathroom use an exhaust fan or open a window after showering.  Wipe down damp surfaces after  showering.  Clean bathrooms with a mold-killing solution (diluted bleach, or products like Tilex, etc) at least once a month. In the kitchen use an exhaust fan to remove steam from cooking.  Throw away spoiled foods immediately, and empty garbage daily.  Empty water pans below  self-defrosting refrigerators frequently. Vent the clothes dryer to the outside. Limit indoor houseplants; mold grows in the dirt.  No houseplants in the bedroom. Remove carpet from the bedroom. Encase the mattress and box springs with a zippered encasing.  Molds - Outdoor avoidance Avoid being outside when the grass is being mowed, or the ground is tilled. Avoid playing in leaves, pine straw, hay, etc.  Dead plant materials contain mold. Avoid going into barns or grain storage areas. Remove leaves, clippings and compost from around the home.  Cockroach Limit spread of food around the house; especially keep food out of bedrooms. Keep food and garbage in closed containers with a tight lid.  Never leave food out in the kitchen.  Do not leave out pet food or dirty food bowls. Mop the kitchen floor and wash countertops at least once a week. Repair leaky pipes and faucets so there is no standing water to attract roaches. Plug up cracks in the house through which cockroaches can enter. Use bait stations and approved pesticides to reduce cockroach infestation. Pollen Avoidance Pollen levels are highest during the mid-day and afternoon.  Consider this when planning outdoor activities. Avoid being outside when the grass is being mowed, or wear a mask if the pollen-allergic person must be the one to mow the grass. Keep the windows closed to keep pollen outside of the home. Use an air conditioner to filter the air. Take a shower, wash hair, and change clothing after working or playing outdoors during pollen season. Pet Dander Keep the pet out of your bedroom and restrict it to only a few rooms. Be advised that keeping the pet  in only one room will not limit the allergens to that room. Don't pet, hug or kiss the pet; if you do, wash your hands with soap and water. High-efficiency particulate air (HEPA) cleaners run continuously in a bedroom or living room can reduce allergen levels over time. Regular use of a high-efficiency vacuum cleaner or a central vacuum can reduce allergen levels. Giving your pet a bath at least once a week can reduce airborne allergen.    Return in about 6 weeks (around 01/03/2024).  Arleta Blanch, MD [facility] 

## 2023-11-22 NOTE — Patient Instructions (Addendum)
 Allergic Rhinitis: - Positive skin test 11/2023: trees, grasses, weeds, molds, dust mites, cats, dogs, cockroach  - Use nasal saline rinses before nose sprays such as with Neilmed Sinus Rinse.  Use distilled water.   - Use Flonase 2 sprays each nostril daily. Aim upward and outward. - Use Azelastine 2 sprays each nostril twice daily as needed for runny nose, drainage, sneezing, congestion. Aim upward and outward. - Use Zyrtec 10 mg daily.  - Consider allergy shots as long term control of your symptoms by teaching your immune system to be more tolerant of your allergy triggers   Mild Intermittent Asthma: - Maintenance inhaler: none - Rescue inhaler: Albuterol 2 puffs or 1 vial via nebulizer every 4-6 hours as needed for respiratory symptoms of cough, shortness of breath, or wheezing Asthma control goals:  Full participation in all desired activities (may need albuterol before activity) Albuterol use two times or less a week on average (not counting use with activity) Cough interfering with sleep two times or less a month Oral steroids no more than once a year No hospitalizations    ALLERGEN AVOIDANCE MEASURES   Dust Mites Use central air conditioning and heat; and change the filter monthly.  Pleated filters work better than mesh filters.  Electrostatic filters may also be used; wash the filter monthly.  Window air conditioners may be used, but do not clean the air as well as a central air conditioner.  Change or wash the filter monthly. Keep windows closed.  Do not use attic fans.   Encase the mattress, box springs and pillows with zippered, dust proof covers. Wash the bed linens in hot water weekly.   Remove carpet, especially from the bedroom. Remove stuffed animals, throw pillows, dust ruffles, heavy drapes and other items that collect dust from the bedroom. Do not use a humidifier.   Use wood, vinyl or leather furniture instead of cloth furniture in the bedroom. Keep the indoor  humidity at 30 - 40%.    Molds - Indoor avoidance Use air conditioning to reduce indoor humidity.  Do not use a humidifier. Keep indoor humidity at 30 - 40%.  Use a dehumidifier if needed. In the bathroom use an exhaust fan or open a window after showering.  Wipe down damp surfaces after showering.  Clean bathrooms with a mold-killing solution (diluted bleach, or products like Tilex, etc) at least once a month. In the kitchen use an exhaust fan to remove steam from cooking.  Throw away spoiled foods immediately, and empty garbage daily.  Empty water pans below self-defrosting refrigerators frequently. Vent the clothes dryer to the outside. Limit indoor houseplants; mold grows in the dirt.  No houseplants in the bedroom. Remove carpet from the bedroom. Encase the mattress and box springs with a zippered encasing.  Molds - Outdoor avoidance Avoid being outside when the grass is being mowed, or the ground is tilled. Avoid playing in leaves, pine straw, hay, etc.  Dead plant materials contain mold. Avoid going into barns or grain storage areas. Remove leaves, clippings and compost from around the home.  Cockroach Limit spread of food around the house; especially keep food out of bedrooms. Keep food and garbage in closed containers with a tight lid.  Never leave food out in the kitchen.  Do not leave out pet food or dirty food bowls. Mop the kitchen floor and wash countertops at least once a week. Repair leaky pipes and faucets so there is no standing water to attract roaches. Plug up  cracks in the house through which cockroaches can enter. Use bait stations and approved pesticides to reduce cockroach infestation. Pollen Avoidance Pollen levels are highest during the mid-day and afternoon.  Consider this when planning outdoor activities. Avoid being outside when the grass is being mowed, or wear a mask if the pollen-allergic person must be the one to mow the grass. Keep the windows closed to  keep pollen outside of the home. Use an air conditioner to filter the air. Take a shower, wash hair, and change clothing after working or playing outdoors during pollen season. Pet Dander Keep the pet out of your bedroom and restrict it to only a few rooms. Be advised that keeping the pet in only one room will not limit the allergens to that room. Don't pet, hug or kiss the pet; if you do, wash your hands with soap and water. High-efficiency particulate air (HEPA) cleaners run continuously in a bedroom or living room can reduce allergen levels over time. Regular use of a high-efficiency vacuum cleaner or a central vacuum can reduce allergen levels. Giving your pet a bath at least once a week can reduce airborne allergen.

## 2024-01-03 ENCOUNTER — Other Ambulatory Visit: Payer: Self-pay

## 2024-01-03 ENCOUNTER — Encounter: Payer: Self-pay | Admitting: Internal Medicine

## 2024-01-03 ENCOUNTER — Ambulatory Visit: Admitting: Internal Medicine

## 2024-01-03 VITALS — BP 148/90 | HR 80 | Temp 98.0°F

## 2024-01-03 DIAGNOSIS — J3089 Other allergic rhinitis: Secondary | ICD-10-CM

## 2024-01-03 DIAGNOSIS — J4520 Mild intermittent asthma, uncomplicated: Secondary | ICD-10-CM

## 2024-01-03 DIAGNOSIS — J302 Other seasonal allergic rhinitis: Secondary | ICD-10-CM

## 2024-01-03 MED ORDER — AZELASTINE-FLUTICASONE 137-50 MCG/ACT NA SUSP: 2.0000 | Freq: Two times a day (BID) | NASAL | 5 refills | Status: AC

## 2024-01-03 NOTE — Patient Instructions (Addendum)
 Allergic Rhinitis: - Positive skin test 11/2023: trees, grasses, weeds, molds, dust mites, cats, dogs, cockroach  - Use nasal saline rinses before nose sprays such as with Neilmed Sinus Rinse.  Use distilled water.   - Use Dysmita 2 sprays each nostril twice daily.  Aim upward and outward.  Once you start this, stop the Flonase /Azelastine .   - Use Flonase  2 sprays each nostril daily. Aim upward and outward. - Use Azelastine  2 sprays each nostril twice daily. Aim upward and outward. - Use Zyrtec  10 mg daily.  - Consider allergy  shots as long term control of your symptoms by teaching your immune system to be more tolerant of your allergy  triggers. Discuss cost and coverage with your insurance company and if interested in starting, please call us  back or mychart message us  and we will get the vials (2 vials, traditional) mixed and Epipen sent in.    Mild Intermittent Asthma: - Maintenance inhaler: none - Rescue inhaler: Albuterol  2 puffs or 1 vial via nebulizer every 4-6 hours as needed for respiratory symptoms of cough, shortness of breath, or wheezing Asthma control goals:  Full participation in all desired activities (may need albuterol  before activity) Albuterol  use two times or less a week on average (not counting use with activity) Cough interfering with sleep two times or less a month Oral steroids no more than once a year No hospitalizations

## 2024-01-03 NOTE — Progress Notes (Signed)
 "  FOLLOW UP Date of Service/Encounter:  01/03/2024   Subjective:  Monique Cardenas (DOB: [birth year]) is a 38 y.o. female who [facility] for follow up for asthma and rhinitis.   History obtained from: chart review and patient. Last visit was with me on 11/22/2023 for testing and discussed at the time, use of Flonase /Azelastine /Zyrtec  and consider AIT.  Also on PRN Albuterol  for asthma, controlled.   Notes still having trouble with frequent congestion, drainage, throat clearing, sneezing.  Using Flonase  and Azelastine  BID and Zyrtec  daily with some relief but not resolution.  She is interested in AIT.  No history of cardiac dx/beta blocker use/malignancy/autoimmune disease.  Asthma has done well. Only has had to use Albuterol  twice since last visit when she noted wheezing when her allergies were flared up.  No ER/urgent care/oral prednisone  use since last visit.  No nighttime symptoms.    Past Medical History: Past Medical History:  Diagnosis Date   Asthma    Eczema     Objective:  BP (!) 148/90 (BP Location: Left Arm, Patient Position: Sitting, Cuff Size: Large)   Pulse 80   Temp 98 F (36.7 C) (Temporal)   SpO2 98%  There is no height or weight on file to calculate BMI. Physical Exam: GEN: alert, well developed HEENT: clear conjunctiva, nose with mild inferior turbinate hypertrophy, boggy nasal mucosa, clear rhinorrhea, + cobblestoning HEART: regular rate and rhythm, no murmur LUNGS: clear to auscultation bilaterally, no coughing, unlabored respiration SKIN: no rashes or lesions  Spirometry:  Tracings reviewed. Her effort: It was hard to get consistent efforts and there is a question as to whether this reflects a maximal maneuver. FVC: 2.42L, 75% predicted  FEV1: 1.88L, 70% predicted FEV1/FVC ratio: 78% Interpretation: Spirometry consistent with possible restrictive disease.  Please see scanned spirometry results for  details.  Assessment:   1. Seasonal and perennial allergic rhinitis   2. Mild intermittent asthma without complication     Plan/Recommendations:  Allergic Rhinitis: - Uncontrolled, will switch to Dymista to simplify routine and also start AIT.  - Positive skin test 11/2023: trees, grasses, weeds, molds, dust mites, cats, dogs, cockroach  - Use nasal saline rinses before nose sprays such as with Neilmed Sinus Rinse.  Use distilled water.   - Use Dysmita 2 sprays each nostril twice daily.  Aim upward and outward.  Once you start this, stop the Flonase /Azelastine .   - Use Flonase  2 sprays each nostril daily. Aim upward and outward. - Use Azelastine  2 sprays each nostril twice daily. Aim upward and outward. - Use Zyrtec  10 mg daily.  - Consider allergy  shots as long term control of your symptoms by teaching your immune system to be more tolerant of your allergy  triggers. Discuss cost and coverage with your insurance company and if interested in starting, please call us  back or mychart message us  and we will get the vials (2 vials, traditional) mixed and Epipen sent in.  Information given and consent signed.   Mild Intermittent Asthma: - Well controlled, spirometry with possible mild restriction, likely effort dependent/obesity, no obstruction.  - Maintenance inhaler: none - Rescue inhaler: Albuterol  2 puffs or 1 vial via nebulizer every 4-6 hours as needed for respiratory symptoms of cough, shortness of breath, or wheezing Asthma control goals:  Full participation in all desired activities (may need albuterol  before activity) Albuterol  use two times or less a week on average (not counting use with activity) Cough interfering with sleep  two times or less a month Oral steroids no more than once a year No hospitalizations       Return in about 4 months (around 05/03/2024).  Arleta Blanch, MD [facility]       "

## 2024-01-04 ENCOUNTER — Other Ambulatory Visit: Payer: Self-pay | Admitting: *Deleted

## 2024-01-04 MED ORDER — AZELASTINE HCL 0.1 % NA SOLN: 2.0000 | Freq: Two times a day (BID) | NASAL | 5 refills | Status: AC | PRN

## 2024-01-04 MED ORDER — FLUTICASONE PROPIONATE 50 MCG/ACT NA SUSP: 2.0000 | Freq: Every day | NASAL | 5 refills | Status: AC

## 2024-01-10 ENCOUNTER — Other Ambulatory Visit: Payer: Self-pay

## 2024-01-10 ENCOUNTER — Encounter (HOSPITAL_COMMUNITY): Payer: Self-pay

## 2024-01-10 ENCOUNTER — Emergency Department (HOSPITAL_COMMUNITY)
Admission: EM | Admit: 2024-01-10 | Discharge: 2024-01-11 | Disposition: A | Discharge status: Home / Self Care | Attending: Emergency Medicine | Admitting: Emergency Medicine

## 2024-01-10 DIAGNOSIS — J101 Influenza due to other identified influenza virus with other respiratory manifestations: Secondary | ICD-10-CM | POA: Insufficient documentation

## 2024-01-10 DIAGNOSIS — J45909 Unspecified asthma, uncomplicated: Secondary | ICD-10-CM | POA: Diagnosis not present

## 2024-01-10 DIAGNOSIS — R051 Acute cough: Secondary | ICD-10-CM

## 2024-01-10 DIAGNOSIS — R059 Cough, unspecified: Secondary | ICD-10-CM | POA: Diagnosis present

## 2024-01-10 LAB — RESP PANEL BY RT-PCR (RSV, FLU A&B, COVID)  RVPGX2
Influenza A by PCR: POSITIVE — AB
Influenza B by PCR: NEGATIVE
Resp Syncytial Virus by PCR: NEGATIVE
SARS Coronavirus 2 by RT PCR: NEGATIVE

## 2024-01-10 NOTE — ED Triage Notes (Signed)
 Patients states she's been having flu like symptoms for 2 days. She has fever, body aches, sneezing, coughing, nasal congestion.  Denies sick contacts.

## 2024-01-10 NOTE — ED Triage Notes (Signed)
 Patient reports cold symptoms (cough, congestion, and sneezing). Symptoms began 2 days ago.

## 2024-01-11 MED ORDER — OSELTAMIVIR PHOSPHATE 75 MG PO CAPS: 75.0000 mg | ORAL_CAPSULE | Freq: Two times a day (BID) | ORAL | 0 refills | Status: AC

## 2024-01-11 MED ORDER — ONDANSETRON 4 MG PO TBDP: 4.0000 mg | ORAL_TABLET | Freq: Three times a day (TID) | ORAL | 0 refills | Status: AC | PRN

## 2024-01-11 MED ORDER — BENZONATATE 100 MG PO CAPS: 100.0000 mg | ORAL_CAPSULE | Freq: Three times a day (TID) | ORAL | 0 refills | Status: AC

## 2024-01-11 NOTE — ED Notes (Signed)
 Read and reviewed d/c instructions and follow up, work note given, scripts reviewed, pt verbalized understanding d/c and follow up, pt from department.

## 2024-01-11 NOTE — Discharge Instructions (Signed)
 It was a pleasure taking care of you today.  As discussed, your flu test was positive which is likely causing your symptoms.  I am sending you home with Tamiflu .  You are right at the window for treatment.  Tamiflu  can cause nausea, vomiting, diarrhea, and headaches.  I am sending you home with cough medication and nausea medication.  Take as needed.  You may also take over-the-counter ibuprofen  or Tylenol  as needed for pain and fever.  Return to the ER for any worsening symptoms.

## 2024-01-11 NOTE — ED Provider Notes (Signed)
 " [place] [facility] Provider Note   CSN: [id number] Arrival date & time: 01/10/24  2026     Patient presents with: URI and Cough   Monique Cardenas is a 39 y.o. female with a past medical history significant for asthma who presents to the ED due to cough, nasal congestion, and myalgias x 2 days.  Patient notes her niece was sick with similar symptoms a few days ago.  Denies nausea, vomiting, diarrhea.  Denies chest pain and shortness of breath.  Denies abdominal pain.  Has been taking over-the-counter flu medication   History obtained from patient and past medical records. No interpreter used during encounter.      Prior to Admission medications  Medication Sig Start Date End Date Taking? Authorizing Provider  benzonatate  (TESSALON ) 100 MG capsule Take 1 capsule (100 mg total) by mouth every 8 (eight) hours. 01/11/24  Yes Analayah Brooke C, PA-C  ondansetron  (ZOFRAN -ODT) 4 MG disintegrating tablet Take 1 tablet (4 mg total) by mouth every 8 (eight) hours as needed for nausea or vomiting. 01/11/24  Yes Yves Fodor, Aleck BROCKS, PA-C  oseltamivir  (TAMIFLU ) 75 MG capsule Take 1 capsule (75 mg total) by mouth every 12 (twelve) hours. 01/11/24  Yes Ignatz Deis, Aleck BROCKS, PA-C  albuterol  (VENTOLIN  HFA) 108 (90 Base) MCG/ACT inhaler Inhale 2 puffs into the lungs every 6 (six) hours as needed for shortness of breath or wheezing. 11/15/23   Tobie Arleta SQUIBB, MD  azelastine  (ASTELIN ) 0.1 % nasal spray Place 2 sprays into both nostrils 2 (two) times daily as needed for allergies. Use in each nostril as directed 01/04/24   Tobie Arleta SQUIBB, MD  Azelastine -Fluticasone  (DYMISTA ) 137-50 MCG/ACT SUSP Place 2 sprays into the nose in the morning and at bedtime. 01/03/24   Tobie Arleta SQUIBB, MD  cetirizine  (ZYRTEC  ALLERGY ) 10 MG tablet Take 1 tablet (10 mg total) by mouth daily. 11/15/23   Tobie Arleta SQUIBB, MD  cyclobenzaprine  (FLEXERIL ) 10 MG tablet Take 1 tablet (10 mg total) by mouth at  bedtime. Patient not taking: Reported on 01/03/2024 11/02/23   Iola Lukes, FNP  fluticasone  (FLONASE ) 50 MCG/ACT nasal spray Place 2 sprays into both nostrils daily. 01/04/24   Tobie Arleta SQUIBB, MD  methocarbamol  (ROBAXIN ) 500 MG tablet Take 1 tablet (500 mg total) by mouth every morning. Patient not taking: Reported on 01/03/2024 11/02/23   Murrill, Samantha, FNP  naproxen  (NAPROSYN ) 500 MG tablet Take 1 tablet (500 mg total) by mouth 2 (two) times daily with a meal. Take with food to avoid stomach upset. Do not take any additional NSAIDs while on this. You may take tylenol  in addition to this if needed for extra pain relief. Patient not taking: Reported on 01/03/2024 11/02/23   Murrill, Samantha, FNP    Allergies: Patient has no known allergies.    Review of Systems  Constitutional:  Positive for fever.  HENT:  Positive for congestion.   Respiratory:  Positive for cough. Negative for shortness of breath.   Cardiovascular:  Negative for chest pain.  Gastrointestinal:  Negative for abdominal pain, diarrhea, nausea and vomiting.    Updated Vital Signs BP (!) 139/93 (BP Location: Left Arm)   Pulse (!) 107   Temp 99.3 F (37.4 C) (Oral)   Resp 19   Ht 5' 4 (1.626 m)   Wt 113.4 kg   LMP 12/24/2023 (Exact Date)   SpO2 100%   BMI 42.91 kg/m   Physical Exam Vitals and nursing note reviewed.  Constitutional:      General: She is not in acute distress.    Appearance: She is not ill-appearing.  HENT:     Head: Normocephalic.  Eyes:     Pupils: Pupils are equal, round, and reactive to light.  Cardiovascular:     Rate and Rhythm: Normal rate and regular rhythm.     Pulses: Normal pulses.     Heart sounds: Normal heart sounds. No murmur heard.    No friction rub. No gallop.  Pulmonary:     Effort: Pulmonary effort is normal.     Breath sounds: Normal breath sounds.     Comments: Respirations equal and unlabored, patient able to speak in full sentences, lungs clear to  auscultation bilaterally Abdominal:     General: Abdomen is flat. There is no distension.     Palpations: Abdomen is soft.     Tenderness: There is no abdominal tenderness. There is no guarding or rebound.  Musculoskeletal:        General: Normal range of motion.     Cervical back: Neck supple.  Skin:    General: Skin is warm and dry.  Neurological:     General: No focal deficit present.     Mental Status: She is alert.  Psychiatric:        Mood and Affect: Mood normal.        Behavior: Behavior normal.     (all labs ordered are listed, but only abnormal results are displayed) Labs Reviewed  RESP PANEL BY RT-PCR (RSV, FLU A&B, COVID)  [id number] - Abnormal; Notable for the following components:      Result Value   Influenza A by PCR POSITIVE (*)    All other components within normal limits    EKG: None  Radiology: No results found.   Procedures   Medications Ordered in the ED - No data to display                                  Medical Decision Making  This patient presents to the ED for concern of flu-like symptoms, this involves an extensive number of treatment options, and is a complaint that carries with it a high risk of complications and morbidity.  The differential diagnosis includes viral process, PNA, meningitis, etc  39 year old female presents to the ED due to flulike symptoms x 2 days.  History of asthma.  Denies chest pain and shortness of breath.  Unfortunately patient waited over 11 hours prior to my initial evaluation due to long wait times.  Upon arrival patient afebrile, not tachycardic or hypoxic.  Patient did develop a slight fever while waiting in the ED of 100.9 F.  Upon recheck after initial evaluation temperature 99.3.  Patient well-appearing on exam.  In no acute distress.  Lungs clear to auscultation bilateral without stridor or wheeze. Low suspicion for PNA. Abdomen soft, nondistended, nontender.  No meningismus to suggest meningitis.  RVP  positive for influenza A which is likely causing patient's symptoms.  Patient is at the very end of Tamiflu  window.  Patient requesting Tamiflu . Patient made aware of possible side effects.  Prescription sent.  Will also send prescription for Tessalon  Perles and Zofran  if she develops any nausea and vomiting.  Advised that she can take over-the-counter ibuprofen  or Tylenol  as needed for pain and fever.  No signs of respiratory distress.  Patient stable for discharge. Strict ED precautions discussed  with patient. Patient states understanding and agrees to plan. Patient discharged home in no acute distress and stable vitals  Hx asthma No PCP    Final diagnoses:  Influenza A  Acute cough    ED Discharge Orders          Ordered    benzonatate  (TESSALON ) 100 MG capsule  Every 8 hours        01/11/24 0743    ondansetron  (ZOFRAN -ODT) 4 MG disintegrating tablet  Every 8 hours PRN        01/11/24 0743    oseltamivir  (TAMIFLU ) 75 MG capsule  Every 12 hours        01/11/24 0743               Lorelle Aleck BROCKS, PA-C 01/11/24 0746    Mannie Pac T, DO 01/14/24 1000  "

## 2024-05-03 ENCOUNTER — Ambulatory Visit: Admitting: Allergy & Immunology
